# Patient Record
Sex: Female | Born: 1965 | Race: White | Hispanic: No | Marital: Single | Smoking: Current every day smoker
Health system: Southern US, Community
[De-identification: ages and names within clinical notes are randomized; demographics above are authoritative.]

## PROBLEM LIST (undated history)

## (undated) ENCOUNTER — Emergency Department (HOSPITAL_BASED_OUTPATIENT_CLINIC_OR_DEPARTMENT_OTHER): Admission: EM | Payer: Managed Care, Other (non HMO) | Source: Ambulatory Visit

## (undated) DIAGNOSIS — F329 Major depressive disorder, single episode, unspecified: Secondary | ICD-10-CM

## (undated) DIAGNOSIS — E785 Hyperlipidemia, unspecified: Secondary | ICD-10-CM

## (undated) DIAGNOSIS — F32A Depression, unspecified: Secondary | ICD-10-CM

## (undated) DIAGNOSIS — I10 Essential (primary) hypertension: Secondary | ICD-10-CM

## (undated) DIAGNOSIS — D751 Secondary polycythemia: Principal | ICD-10-CM

## (undated) HISTORY — DX: Depression, unspecified: F32.A

## (undated) HISTORY — DX: Secondary polycythemia: D75.1

## (undated) HISTORY — DX: Hyperlipidemia, unspecified: E78.5

## (undated) HISTORY — PX: ABDOMINAL HYSTERECTOMY: SHX81

## (undated) HISTORY — DX: Major depressive disorder, single episode, unspecified: F32.9

---

## 2010-10-03 ENCOUNTER — Emergency Department (HOSPITAL_BASED_OUTPATIENT_CLINIC_OR_DEPARTMENT_OTHER)
Admission: EM | Admit: 2010-10-03 | Discharge: 2010-10-03 | Disposition: A | Discharge status: Home / Self Care | Payer: Managed Care, Other (non HMO) | Attending: Emergency Medicine | Admitting: Emergency Medicine

## 2010-10-03 ENCOUNTER — Encounter: Payer: Self-pay | Admitting: Emergency Medicine

## 2010-10-03 ENCOUNTER — Other Ambulatory Visit: Payer: Self-pay

## 2010-10-03 ENCOUNTER — Emergency Department (INDEPENDENT_AMBULATORY_CARE_PROVIDER_SITE_OTHER): Payer: Managed Care, Other (non HMO)

## 2010-10-03 DIAGNOSIS — F17200 Nicotine dependence, unspecified, uncomplicated: Secondary | ICD-10-CM | POA: Insufficient documentation

## 2010-10-03 DIAGNOSIS — I10 Essential (primary) hypertension: Secondary | ICD-10-CM | POA: Insufficient documentation

## 2010-10-03 DIAGNOSIS — R079 Chest pain, unspecified: Secondary | ICD-10-CM

## 2010-10-03 HISTORY — DX: Essential (primary) hypertension: I10

## 2010-10-03 LAB — D-DIMER, QUANTITATIVE: D-Dimer, Quant: 0.25 ug/mL-FEU (ref 0.00–0.48)

## 2010-10-03 LAB — DIFFERENTIAL
Basophils Absolute: 0.1 10*3/uL (ref 0.0–0.1)
Basophils Relative: 1 % (ref 0–1)
Eosinophils Absolute: 0.1 10*3/uL (ref 0.0–0.7)
Eosinophils Relative: 1 % (ref 0–5)
Monocytes Absolute: 0.5 10*3/uL (ref 0.1–1.0)

## 2010-10-03 LAB — BASIC METABOLIC PANEL
Calcium: 10.1 mg/dL (ref 8.4–10.5)
Creatinine, Ser: 0.7 mg/dL (ref 0.50–1.10)
GFR calc Af Amer: >60 mL/min (ref >60)

## 2010-10-03 LAB — CBC
HCT: 44.7 % (ref 36.0–46.0)
MCHC: 35.8 g/dL (ref 30.0–36.0)
MCV: 92.2 fL (ref 78.0–100.0)
RDW: 12.7 % (ref 11.5–15.5)

## 2010-10-03 MED ORDER — HYDROCODONE-ACETAMINOPHEN 5-325 MG PO TABS: 1.0000 | ORAL_TABLET | ORAL | Status: AC | PRN

## 2010-10-03 MED ORDER — NAPROXEN 500 MG PO TABS: 500.0000 mg | ORAL_TABLET | Freq: Two times a day (BID) | ORAL | Status: DC

## 2010-10-03 NOTE — ED Provider Notes (Signed)
History     CSN: [id number] Arrival date & time: 10/03/2010 11:55 AM  Chief Complaint  Patient presents with  . Chest Pain   Patient is a 45 y.o. female presenting with chest pain. The history is provided by the patient.  Chest Pain Episode onset: She has been having pain for two weeks. Chest pain occurs intermittently. The chest pain is worsening. Associated with: Pain only comes at night. It is not affected by breathing, movement or body position. The quality of the pain is described as sharp. The pain does not radiate. Pertinent negatives for primary symptoms include no fever, no shortness of breath, no cough, no wheezing, no palpitations, no nausea, no vomiting and no dizziness. She tried nothing for the symptoms. Risk factors include smoking/tobacco exposure.   Episodes last 2-5 minutes, and have been coming more frequently. Symptoms are severe. Pain is 10/10 when present, 0/10 currently.  Past Medical History  Diagnosis Date  . Hypertension     Past Surgical History  Procedure Date  . Abdominal hysterectomy     Family History  Problem Relation Age of Onset  . Hypertension Mother     History  Substance Use Topics  . Smoking status: Current Everyday Smoker  . Smokeless tobacco: Not on file  . Alcohol Use: Yes     36-48 oz of malt beverages per day    OB History    Grav Para Term Preterm Abortions TAB SAB Ect Mult Living                  Review of Systems  Constitutional: Negative for fever.  Respiratory: Negative for cough, shortness of breath and wheezing.   Cardiovascular: Positive for chest pain. Negative for palpitations.  Gastrointestinal: Negative for nausea and vomiting.  Neurological: Negative for dizziness.    Physical Exam  BP 150/84  Pulse 73  Temp(Src) 98.3 F (36.8 C) (Oral)  Resp 16  SpO2 99%  Physical Exam  Nursing note and vitals reviewed. Constitutional: She is oriented to person, place, and time. She appears well-developed and  well-nourished. No distress.  HENT:  Head: Normocephalic and atraumatic.  Right Ear: External ear normal.  Left Ear: External ear normal.  Nose: Nose normal.  Mouth/Throat: Oropharynx is clear and moist.  Eyes: Conjunctivae and EOM are normal. Pupils are equal, round, and reactive to light. No scleral icterus.  Neck: Normal range of motion. Neck supple. No JVD present.  Cardiovascular: Normal rate, regular rhythm and normal heart sounds.   No murmur heard. Pulmonary/Chest: Effort normal and breath sounds normal. She has no wheezes. She has no rales. She exhibits no tenderness.  Abdominal: Soft. Bowel sounds are normal. She exhibits no mass. There is no tenderness.  Musculoskeletal: Normal range of motion. She exhibits no edema and no tenderness.  Lymphadenopathy:    She has no cervical adenopathy.  Neurological: She is alert and oriented to person, place, and time. Coordination normal.  Skin: Skin is warm and dry. No rash noted.  Psychiatric: She has a normal mood and affect.    ED Course  Procedures ECG shows normal sinus rhythm with a rate of 67, no ectopy. Normal axis. Normal P wave. Normal QRS. Normal intervals. Normal ST and T waves. Impression: normal ECG.  MDM No sign of significant pathology. Will treat symptomatically. Labs and x-rays reviewed, images viewed by me.  Results for orders placed during the hospital encounter of 10/03/10  CBC      Component Value Range  WBC 7.6  4.0 - 10.5 (K/uL)   RBC 4.85  3.87 - 5.11 (MIL/uL)   Hemoglobin 16.0 (*) 12.0 - 15.0 (g/dL)   HCT 11.9  14.7 - 82.9 (%)   MCV 92.2  78.0 - 100.0 (fL)   MCH 33.0  26.0 - 34.0 (pg)   MCHC 35.8  30.0 - 36.0 (g/dL)   RDW 56.2  13.0 - 86.5 (%)   Platelets 206  150 - 400 (K/uL)  DIFFERENTIAL      Component Value Range   Neutrophils Relative 56  43 - 77 (%)   Neutro Abs 4.3  1.7 - 7.7 (K/uL)   Lymphocytes Relative 36  12 - 46 (%)   Lymphs Abs 2.7  0.7 - 4.0 (K/uL)   Monocytes Relative 6  3 - 12 (%)     Monocytes Absolute 0.5  0.1 - 1.0 (K/uL)   Eosinophils Relative 1  0 - 5 (%)   Eosinophils Absolute 0.1  0.0 - 0.7 (K/uL)   Basophils Relative 1  0 - 1 (%)   Basophils Absolute 0.1  0.0 - 0.1 (K/uL)  BASIC METABOLIC PANEL      Component Value Range   Sodium 135  135 - 145 (mEq/L)   Potassium 3.9  3.5 - 5.1 (mEq/L)   Chloride 101  96 - 112 (mEq/L)   CO2 21  19 - 32 (mEq/L)   Glucose, Bld 97  70 - 99 (mg/dL)   BUN 9  6 - 23 (mg/dL)   Creatinine, Ser 7.84  0.50 - 1.10 (mg/dL)   Calcium 69.6  8.4 - 10.5 (mg/dL)   GFR calc non Af Amer >60  >60 (mL/min)   GFR calc Af Amer >60  >60 (mL/min)  D-DIMER, QUANTITATIVE      Component Value Range   D-Dimer, Quant 0.25  0.00 - 0.48 (ug/mL-FEU)   Dg Chest 2 View  10/03/2010  *RADIOLOGY REPORT*  Clinical Data: 45 year old female with chest pain, night time episodes becoming more frequent.  CHEST - 2 VIEW  Comparison: None.  Findings: Lung volumes are within normal limits, there is mild elevation of the right hemidiaphragm. Normal cardiac size and mediastinal contours.  Visualized tracheal air column is within normal limits.  No pneumothorax, pulmonary edema, pleural effusion or confluent pulmonary opacity.  Mild diffuse increased interstitial markings, likely related to smoking history. No osseous abnormality identified.  IMPRESSION: No acute cardiopulmonary abnormality.  Original Report Authenticated By: Harley Hallmark, M.D.          Dione Booze, MD 10/03/10 1359

## 2010-10-03 NOTE — ED Notes (Signed)
Pt reports intermittent MS CP that wakes her up at night x 2 wks.  Sts episodes are becoming more frequent; only occur at night. Denies other sx, except for a "light sheen of sweat"

## 2011-06-15 ENCOUNTER — Ambulatory Visit: Payer: Managed Care, Other (non HMO) | Admitting: Family Medicine

## 2011-06-16 ENCOUNTER — Ambulatory Visit (INDEPENDENT_AMBULATORY_CARE_PROVIDER_SITE_OTHER): Payer: Managed Care, Other (non HMO) | Admitting: Family Medicine

## 2011-06-16 ENCOUNTER — Encounter: Payer: Self-pay | Admitting: Family Medicine

## 2011-06-16 VITALS — BP 142/82 | HR 65 | Temp 98.2°F | Ht 60.0 in | Wt 159.6 lb

## 2011-06-16 DIAGNOSIS — I10 Essential (primary) hypertension: Secondary | ICD-10-CM

## 2011-06-16 DIAGNOSIS — R1013 Epigastric pain: Secondary | ICD-10-CM | POA: Insufficient documentation

## 2011-06-16 LAB — LIPID PANEL
HDL: 39 mg/dL — ABNORMAL LOW (ref >39.00)
LDL Cholesterol: 123 mg/dL — ABNORMAL HIGH (ref 0–99)
Total CHOL/HDL Ratio: 5
Triglycerides: 150 mg/dL — ABNORMAL HIGH (ref 0.0–149.0)
VLDL: 30 mg/dL (ref 0.0–40.0)

## 2011-06-16 LAB — HEPATIC FUNCTION PANEL
Alkaline Phosphatase: 70 U/L (ref 39–117)
Bilirubin, Direct: 0.1 mg/dL (ref 0.0–0.3)
Total Bilirubin: 0.4 mg/dL (ref 0.3–1.2)
Total Protein: 7.5 g/dL (ref 6.0–8.3)

## 2011-06-16 LAB — CBC WITH DIFFERENTIAL/PLATELET
Basophils Absolute: 0.1 10*3/uL (ref 0.0–0.1)
Eosinophils Absolute: 0.1 10*3/uL (ref 0.0–0.7)
Lymphocytes Relative: 36.3 % (ref 12.0–46.0)
Lymphs Abs: 2.6 10*3/uL (ref 0.7–4.0)
Monocytes Relative: 5.3 % (ref 3.0–12.0)
Platelets: 197 10*3/uL (ref 150.0–400.0)
RDW: 13.4 % (ref 11.5–14.6)

## 2011-06-16 LAB — BASIC METABOLIC PANEL
CO2: 24 mEq/L (ref 19–32)
Calcium: 9.5 mg/dL (ref 8.4–10.5)
Chloride: 102 mEq/L (ref 96–112)
Sodium: 139 mEq/L (ref 135–145)

## 2011-06-16 LAB — LIPASE: Lipase: 19 U/L (ref 11.0–59.0)

## 2011-06-16 MED ORDER — LISINOPRIL-HYDROCHLOROTHIAZIDE 10-12.5 MG PO TABS: 1.0000 | ORAL_TABLET | Freq: Every day | ORAL | Status: DC

## 2011-06-16 MED ORDER — OMEPRAZOLE 40 MG PO CPDR: 40.0000 mg | DELAYED_RELEASE_CAPSULE | Freq: Every day | ORAL | Status: DC

## 2011-06-16 NOTE — Assessment & Plan Note (Signed)
New.  sxs most consistent w/ GERD.  Discussed triggers- caffeine, ETOH, tobacco.  Will check labs to r/o infxn, PUD, pancreatitis.  Start PPI.  Reviewed supportive care and red flags that should prompt return.  Pt expressed understanding and is in agreement w/ plan.

## 2011-06-16 NOTE — Progress Notes (Signed)
  Subjective:    Patient ID: Rachel Scott, female    DOB: [birth year], 46 y.o.   MRN: [id number]  HPI New to establish.  GYN- Dr Noralyn Pick at [facility].  No previous PCP.  HTN- dx'd 1 yr ago, on HCTZ 25mg  daily.  BP not at goal.  Smoking 1 ppd.  Not exercising regularly.  No SOB.  + CP (see below).  No HAs, visual changes, edema.    Epigastric/Substernal pain- went to ER in August for this.  Has had sxs nightly since then.  Was keeping food diary b/c she felt it was food related.  Only occurs at night.  Lasts 3-5 minutes.  Described as 'very sharp'.  'on a scale of 1-10 it's a 15'.  Reports it 'feels like a band across my chest and both arms go numb'.  Reports sxs will dissipate on their own.  Had similar sxs this week after drinking coffee in the AM.  Does not eat late in the evening- eats before 6 and goes to bed at 10.  Will have ETOH 7pm nightly.   Review of Systems For ROS see HPI     Objective:   Physical Exam  Vitals reviewed. Constitutional: She is oriented to person, place, and time. She appears well-developed and well-nourished. No distress.  HENT:  Head: Normocephalic and atraumatic.  Eyes: Conjunctivae and EOM are normal. Pupils are equal, round, and reactive to light.  Neck: Normal range of motion. Neck supple. No thyromegaly present.  Cardiovascular: Normal rate, regular rhythm, normal heart sounds and intact distal pulses.   No murmur heard. Pulmonary/Chest: Effort normal and breath sounds normal. No respiratory distress.  Abdominal: Soft. She exhibits no distension. There is no tenderness.  Musculoskeletal: She exhibits no edema.  Lymphadenopathy:    She has no cervical adenopathy.  Neurological: She is alert and oriented to person, place, and time.  Skin: Skin is warm and dry.  Psychiatric: She has a normal mood and affect. Her behavior is normal.          Assessment & Plan:

## 2011-06-16 NOTE — Patient Instructions (Signed)
Follow up in 1 month to recheck BP Start the Omeprazole daily for the reflux STOP the hydrochlorothiazide START the Lisinopril HCT daily We'll notify you of your lab results Call with any questions or concerns Welcome!  We're glad to have you!

## 2011-06-16 NOTE — Assessment & Plan Note (Signed)
New to provider.  Not well controlled.  Switch to Lisinopril HCT.  EKG normal.  Will follow closely due to med change.

## 2011-06-20 ENCOUNTER — Encounter: Payer: Self-pay | Admitting: Family Medicine

## 2011-06-20 DIAGNOSIS — R12 Heartburn: Secondary | ICD-10-CM

## 2011-06-20 MED ORDER — ESOMEPRAZOLE MAGNESIUM 40 MG PO CPDR: 40.0000 mg | DELAYED_RELEASE_CAPSULE | Freq: Every day | ORAL | Status: DC

## 2011-06-20 NOTE — Telephone Encounter (Signed)
Please advise 

## 2011-06-20 NOTE — Telephone Encounter (Signed)
Called pt and left vm on home phone, called work number and notified pt, pt understood new rx instructions for Nexium 40mg  take one capsule daily, sent via escribe to [facility] on [address], also notified pt that we sent a referral for GI and that someone will contact her with an upcoming apt, pt understood all instructions,

## 2011-06-21 ENCOUNTER — Encounter: Payer: Self-pay | Admitting: Gastroenterology

## 2011-06-22 ENCOUNTER — Encounter: Payer: Self-pay | Admitting: *Deleted

## 2011-07-08 ENCOUNTER — Encounter: Payer: Self-pay | Admitting: Gastroenterology

## 2011-07-08 ENCOUNTER — Ambulatory Visit (INDEPENDENT_AMBULATORY_CARE_PROVIDER_SITE_OTHER): Payer: Managed Care, Other (non HMO) | Admitting: Gastroenterology

## 2011-07-08 VITALS — BP 110/72 | HR 77 | Ht 60.0 in | Wt 156.8 lb

## 2011-07-08 DIAGNOSIS — D45 Polycythemia vera: Secondary | ICD-10-CM

## 2011-07-08 DIAGNOSIS — D751 Secondary polycythemia: Secondary | ICD-10-CM

## 2011-07-08 DIAGNOSIS — K219 Gastro-esophageal reflux disease without esophagitis: Secondary | ICD-10-CM

## 2011-07-08 DIAGNOSIS — R079 Chest pain, unspecified: Secondary | ICD-10-CM

## 2011-07-08 MED ORDER — SUCRALFATE 1 GM/10ML PO SUSP: ORAL | Status: DC

## 2011-07-08 MED ORDER — ESOMEPRAZOLE MAGNESIUM 40 MG PO CPDR: DELAYED_RELEASE_CAPSULE | ORAL | Status: DC

## 2011-07-08 NOTE — Progress Notes (Signed)
.   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 She presents today on referral from Dr. Beverely Low for evaluation of a one year history of atypical chest pain. Her discomfort is in the subxiphoid area and initially was occurring only in the middle of the night when she was awakened from sleep. Over the last several months she's had almost day and nighttime subxiphoid discomfort without typical regurgitation or burning pain or dysphagia. She has not tried antacids or regular PPI. She's had negative cardiac evaluation, and denies any specific cardiovascular or pulmonary complaints. She does take lisinopril-HCTZ daily. Patient not had previous endoscopy or colonoscopy. She recently was placed on Nexium 40 mg a day which she has not taken. She does smoke at least one pack of cigarettes per day, and his use 45 beers a day for many years. There is no history of known hepatitis or pancreatitis. Family history is noncontributory. She denies Raynaud's phenomenon  or any symptoms of collagen vascular disease.  I have reviewed this patient's present history, medical and surgical past history, allergies and medications.     ROS: The remainder of the 10 point ROS is negative... no history of severe allergies. She does have chronic anxiety and chronic insomnia. Patient underwent hysterectomy in December 2011. She has been hypertensive for disease 2 years. She does have mild constipation, history of hemorrhoids, and occasional have some bright red blood per rectum.     Physical Exam: Healthy patient in no distress appears stated age. Blood pressure today 110/72, pulse 77 and regular, weight 156 pounds with a BMI of 30.62. No stigmata of chronic liver disease. General well developed well nourished patient in no acute distress, appearing her stated age Eyes PERRLA, no icterus, fundoscopic exam per opthamologist Skin no lesions noted Neck supple, no adenopathy, no  thyroid enlargement, no tenderness Chest clear to percussion and auscultation Heart no significant murmurs, gallops or rubs noted Abdomen no hepatosplenomegaly masses or tenderness, BS normal. Tenderness in the subxiphoid area without any definite mass or evidence of herniation. Extremities no acute joint lesions, edema, phlebitis or evidence of cellulitis. Neurologic patient oriented x 3, cranial nerves intact, no focal neurologic deficits noted. Psychological mental status normal and normal affect.  Assessment and plan: Her symptoms are consistent with a hiatal hernia and chronic GERD, rule out Barrett's esophagus. I've asked her to take her Nexium 40 mg before dinner with Carafate 1 tablespoon at bedtime and when necessary as needed any endoscopic exam. Since her symptoms have been largely nocturnal, I have suggested that she elevate the head of her bed and avoid food within 4 hours of going to sleep. Currently she takes Benadryl at bedtime to help her sleep. She may need upper abdominal ultrasound exam completeness. Review of labs shows normal CBC and metabolic profile and negative serologies for H. pylori. Patient does have polycythemia with a hemoglobin of 16.7 and hematocrit of 49.1, probably related to her smoking and developing COPD. She may need referral to hematology for evaluation of this problem.  Encounter Diagnosis  Name Primary?  . Esophageal reflux Yes

## 2011-07-08 NOTE — Patient Instructions (Addendum)
You have been given a separate informational sheet regarding your tobacco use, the importance of quitting and local resources to help you quit. Your procedure has been scheduled for 07/15/2011, please follow the seperate instructions.  Take Nexium 30 min before dinner.  Take Carafate 1 tsp everynight before bed.  Your prescription(s) have been sent to you pharmacy, Nexium and Carafate.  Today you watched the [employer].  Follow the [employer].   Diet for GERD or PUD Nutrition therapy can help ease the discomfort of gastroesophageal reflux disease (GERD) and peptic ulcer disease (PUD).  HOME CARE INSTRUCTIONS   Eat your meals slowly, in a relaxed setting.   Eat 5 to 6 small meals per day.   If a food causes distress, stop eating it for a period of time.  FOODS TO AVOID  Coffee, regular or decaffeinated.   Cola beverages, regular or low calorie.   Tea, regular or decaffeinated.   Pepper.   Cocoa.   High fat foods, including meats.   Butter, margarine, hydrogenated oil (trans fats).   Peppermint or spearmint (if you have GERD).   Fruits and vegetables if not tolerated.   Alcohol.   Nicotine (smoking or chewing). This is one of the most potent stimulants to acid production in the gastrointestinal tract.   Any food that seems to aggravate your condition.  If you have questions regarding your diet, ask your caregiver or a registered dietitian. TIPS  Lying flat may make symptoms worse. Keep the head of your bed raised 6 to 9 inches (15 to 23 cm) by using a foam wedge or blocks under the legs of the bed.   Do not lay down until 3 hours after eating a meal.   Daily physical activity may help reduce symptoms.  MAKE SURE YOU:   Understand these instructions.   Will watch your condition.   Will get help right away if you are not doing well or get worse.  Document Released: 02/07/2005 Document Revised: 01/27/2011 Document Reviewed: 12/24/2010 [facility] Patient Information  2012 [place], [place].

## 2011-07-11 ENCOUNTER — Encounter: Payer: Self-pay | Admitting: Family Medicine

## 2011-07-11 NOTE — Telephone Encounter (Signed)
Please note

## 2011-07-13 ENCOUNTER — Ambulatory Visit (INDEPENDENT_AMBULATORY_CARE_PROVIDER_SITE_OTHER): Payer: Managed Care, Other (non HMO) | Admitting: Family Medicine

## 2011-07-13 ENCOUNTER — Encounter: Payer: Self-pay | Admitting: Family Medicine

## 2011-07-13 ENCOUNTER — Telehealth: Payer: Self-pay | Admitting: Gastroenterology

## 2011-07-13 VITALS — BP 118/82 | HR 82 | Temp 98.2°F | Ht 61.0 in | Wt 158.6 lb

## 2011-07-13 DIAGNOSIS — I10 Essential (primary) hypertension: Secondary | ICD-10-CM

## 2011-07-13 DIAGNOSIS — R7989 Other specified abnormal findings of blood chemistry: Secondary | ICD-10-CM | POA: Insufficient documentation

## 2011-07-13 DIAGNOSIS — D582 Other hemoglobinopathies: Secondary | ICD-10-CM

## 2011-07-13 DIAGNOSIS — R718 Other abnormality of red blood cells: Secondary | ICD-10-CM | POA: Insufficient documentation

## 2011-07-13 LAB — HEPATIC FUNCTION PANEL
Alkaline Phosphatase: 66 U/L (ref 39–117)
Bilirubin, Direct: 0 mg/dL (ref 0.0–0.3)

## 2011-07-13 LAB — FOLATE: Folate: 8.4 ng/mL (ref >5.9)

## 2011-07-13 LAB — BASIC METABOLIC PANEL
BUN: 13 mg/dL (ref 6–23)
CO2: 28 mEq/L (ref 19–32)
Calcium: 10.4 mg/dL (ref 8.4–10.5)
Creatinine, Ser: 0.9 mg/dL (ref 0.4–1.2)
Glucose, Bld: 90 mg/dL (ref 70–99)

## 2011-07-13 LAB — CBC WITH DIFFERENTIAL/PLATELET
Basophils Absolute: 0.1 10*3/uL (ref 0.0–0.1)
Eosinophils Absolute: 0.1 10*3/uL (ref 0.0–0.7)
Lymphocytes Relative: 34.4 % (ref 12.0–46.0)
MCHC: 32.9 g/dL (ref 30.0–36.0)
MCV: 103.2 fl — ABNORMAL HIGH (ref 78.0–100.0)
Monocytes Absolute: 0.7 10*3/uL (ref 0.1–1.0)
Neutrophils Relative %: 56.8 % (ref 43.0–77.0)
Platelets: 224 10*3/uL (ref 150.0–400.0)
RDW: 12.8 % (ref 11.5–14.6)

## 2011-07-13 NOTE — Assessment & Plan Note (Signed)
New.  Likely due to pt's daily ETOH intake.  Will repeat LFTs.  Pt has endoscopy and GI f/u pending.

## 2011-07-13 NOTE — Assessment & Plan Note (Signed)
Discovered on labs at last visit.  Normal hemoglobin so not megaloblastic anemia.  Check B12/folate- may be related to pt's ETOH consumption.  Will follow.

## 2011-07-13 NOTE — Assessment & Plan Note (Signed)
Improved.  Check BMP due to additional of lisinopril.  Continue current med unless abnormal labs.

## 2011-07-13 NOTE — Patient Instructions (Signed)
Schedule your complete physical for October- don't eat before this appt We'll notify you of your lab results and make any changes if needed You look great! Continue the lisinopril HCT Call with any questions or concerns Happy Memorial Day!!!

## 2011-07-13 NOTE — Progress Notes (Signed)
  Subjective:    Patient ID: Rachel Scott, female    DOB: [birth year], 46 y.o.   MRN: [id number]  HPI HTN- chronic problem, much better control on Lisinopril HCT.  No CP, SOB, HAs, visual changes, edema.  Elevated LFTs- seen on previous labs, due for recheck  Increased MCV- in setting of normal hgb    Review of Systems For ROS see HPI     Objective:   Physical Exam  Vitals reviewed. Constitutional: She is oriented to person, place, and time. She appears well-developed and well-nourished. No distress.  HENT:  Head: Normocephalic and atraumatic.  Eyes: Conjunctivae and EOM are normal. Pupils are equal, round, and reactive to light.  Neck: Normal range of motion. Neck supple. No thyromegaly present.  Cardiovascular: Normal rate, regular rhythm, normal heart sounds and intact distal pulses.   No murmur heard. Pulmonary/Chest: Effort normal and breath sounds normal. No respiratory distress.  Abdominal: Soft. She exhibits no distension. There is no tenderness.  Musculoskeletal: She exhibits no edema.  Lymphadenopathy:    She has no cervical adenopathy.  Neurological: She is alert and oriented to person, place, and time.  Skin: Skin is warm and dry.  Psychiatric: She has a normal mood and affect. Her behavior is normal.          Assessment & Plan:

## 2011-07-13 NOTE — Telephone Encounter (Signed)
Pt reports she saw her PCP this am and spoke with her about her upcoming EGD and possible GB problems. Dr Beverely Low made her understand why she needs the EGD 1st. Also, Dr Jarold Motto wrote in his notes if EGD is OK he will probably order an U/S of the upper abdomen. Pt stated understanding.

## 2011-07-15 ENCOUNTER — Ambulatory Visit (AMBULATORY_SURGERY_CENTER): Payer: Managed Care, Other (non HMO) | Admitting: Gastroenterology

## 2011-07-15 ENCOUNTER — Encounter: Payer: Self-pay | Admitting: Gastroenterology

## 2011-07-15 VITALS — BP 113/72 | HR 78 | Temp 97.7°F | Resp 16 | Ht 61.0 in | Wt 158.0 lb

## 2011-07-15 DIAGNOSIS — R1013 Epigastric pain: Secondary | ICD-10-CM

## 2011-07-15 DIAGNOSIS — K219 Gastro-esophageal reflux disease without esophagitis: Secondary | ICD-10-CM

## 2011-07-15 DIAGNOSIS — R079 Chest pain, unspecified: Secondary | ICD-10-CM

## 2011-07-15 DIAGNOSIS — D130 Benign neoplasm of esophagus: Secondary | ICD-10-CM

## 2011-07-15 MED ORDER — SODIUM CHLORIDE 0.9 % IV SOLN: 500.0000 mL | INTRAVENOUS | Status: DC

## 2011-07-15 NOTE — Patient Instructions (Signed)
FOLLOW DISCHARGE INSTRUCTIONS. HIATAL HERNIA SEEN TODAY. SEE HANDOUT. RESUME CURRENT MEDICATIONS. AWAIT BIOPSIES RESULTS. DR.PATTERSON'S OFFICE NURSE WILL CALL YOU WITH GALLBLADDER SCAN APPOINTMENT.  THANK YOU!   YOU HAD AN ENDOSCOPIC PROCEDURE TODAY AT THE [facility]: Refer to the procedure report that was given to you for any specific questions about what was found during the examination.  If the procedure report does not answer your questions, please call your gastroenterologist to clarify.  If you requested that your care partner not be given the details of your procedure findings, then the procedure report has been included in a sealed envelope for you to review at your convenience later.  YOU SHOULD EXPECT: Some feelings of bloating in the abdomen. Passage of more gas than usual.  Walking can help get rid of the air that was put into your GI tract during the procedure and reduce the bloating. If you had a lower endoscopy (such as a colonoscopy or flexible sigmoidoscopy) you may notice spotting of blood in your stool or on the toilet paper. If you underwent a bowel prep for your procedure, then you may not have a normal bowel movement for a few days.  DIET: Your first meal following the procedure should be a light meal and then it is ok to progress to your normal diet.  A half-sandwich or bowl of soup is an example of a good first meal.  Heavy or fried foods are harder to digest and may make you feel nauseous or bloated.  Likewise meals heavy in dairy and vegetables can cause extra gas to form and this can also increase the bloating.  Drink plenty of fluids but you should avoid alcoholic beverages for 24 hours.  ACTIVITY: Your care partner should take you home directly after the procedure.  You should plan to take it easy, moving slowly for the rest of the day.  You can resume normal activity the day after the procedure however you should NOT DRIVE or use heavy machinery for 24 hours  (because of the sedation medicines used during the test).    SYMPTOMS TO REPORT IMMEDIATELY: A gastroenterologist can be reached at any hour.  During normal business hours, 8:30 AM to 5:00 PM Monday through Friday, call [phone].  After hours and on weekends, please call the GI answering service at [phone] who will take a message and have the physician on call contact you.    Following upper endoscopy (EGD)  Vomiting of blood or coffee ground material  New chest pain or pain under the shoulder blades  Painful or persistently difficult swallowing  New shortness of breath  Fever of 100F or higher  Black, tarry-looking stools  FOLLOW UP: If any biopsies were taken you will be contacted by phone or by letter within the next 1-3 weeks.  Call your gastroenterologist if you have not heard about the biopsies in 3 weeks.  Our staff will call the home number listed on your records the next business day following your procedure to check on you and address any questions or concerns that you may have at that time regarding the information given to you following your procedure. This is a courtesy call and so if there is no answer at the home number and we have not heard from you through the emergency physician on call, we will assume that you have returned to your regular daily activities without incident.  SIGNATURES/CONFIDENTIALITY: You and/or your care partner have signed paperwork which will be entered  into your electronic medical record.  These signatures attest to the fact that that the information above on your After Visit Summary has been reviewed and is understood.  Full responsibility of the confidentiality of this discharge information lies with you and/or your care-partner.

## 2011-07-15 NOTE — Op Note (Signed)
[facility] [address] [place], [place]  [address]  ENDOSCOPY PROCEDURE REPORT  PATIENT:  Rachel, Scott  MR#:  [id number] BIRTHDATE:  [birth year], 45 yrs. old  GENDER:  female  ENDOSCOPIST:  Vania Rea. Jarold Motto, MD, [facility] Referred by:  Neena Rhymes, M.D.  PROCEDURE DATE:  07/15/2011 PROCEDURE:  EGD with biopsy for H. pylori 19147, EGD with biopsy, 43239 ASA CLASS:  Class II INDICATIONS:  CHEST UNRESPONSIVE TO RX.  MEDICATIONS:   propofol (Diprivan) 180 mg IV TOPICAL ANESTHETIC:  DESCRIPTION OF PROCEDURE:   After the risks and benefits of the procedure were explained, informed consent was obtained.  The LB GIF-H180 [id number] endoscope was introduced through the mouth and advanced to the second portion of the duodenum.  The instrument was slowly withdrawn as the mucosa was fully examined. <<PROCEDUREIMAGES>>  The upper, middle, and distal third of the esophagus were carefully inspected and no abnormalities were noted. The z-line was well seen at the GEJ. The endoscope was pushed into the fundus which was normal including a retroflexed view. The antrum,gastric body, first and second part of the duodenum were unremarkable. ESOPHAGEAL BIOPSIES DONE.CLO BX. DONE.    Retroflexed views revealed a hiatal hernia.  SMALL HH NOTED AND SCHATZSKI'S RING. The scope was then withdrawn from the patient and the procedure completed.  COMPLICATIONS:  None  ENDOSCOPIC IMPRESSION: 1) Normal EGD 2) A hiatal hernia RECOMMENDATIONS: 1) Await biopsy results  ______________________________ Vania Rea. Jarold Motto, MD, [facility]  CC:  n. eSIGNED:   Vania Rea. Alcides Nutting at 07/15/2011 04:05 PM  Erskine Emery, [phone]

## 2011-07-15 NOTE — Progress Notes (Signed)
Patient did not experience any of the following events: a burn prior to discharge; a fall within the facility; wrong site/side/patient/procedure/implant event; or a hospital transfer or hospital admission upon discharge from the facility. (G8907) Patient did not have preoperative order for IV antibiotic SSI prophylaxis. (G8918)  

## 2011-07-19 ENCOUNTER — Telehealth: Payer: Self-pay

## 2011-07-19 ENCOUNTER — Ambulatory Visit: Payer: Managed Care, Other (non HMO) | Admitting: Family Medicine

## 2011-07-19 LAB — HELICOBACTER PYLORI SCREEN-BIOPSY: UREASE: NEGATIVE

## 2011-07-19 NOTE — Telephone Encounter (Signed)
I left a message on the ID answering machine at 07:49 for the pt to call if any questions or concerns. Maw

## 2011-07-20 ENCOUNTER — Telehealth: Payer: Self-pay | Admitting: *Deleted

## 2011-07-20 ENCOUNTER — Encounter: Payer: Self-pay | Admitting: Gastroenterology

## 2011-07-20 ENCOUNTER — Other Ambulatory Visit: Payer: Self-pay | Admitting: Gastroenterology

## 2011-07-20 NOTE — Telephone Encounter (Signed)
I have spoken to patient and advised her of time and date of abdominal ultrasound as well as all prep instructions. Patient verbalizes understanding but may call back and reschedule.

## 2011-07-20 NOTE — Telephone Encounter (Signed)
Dr Jarold Motto asked that patient be scheduled for an abdominal ultrasound after negative endoscopy for chest pain as well as negative cardiology eval. Patient has been scheduled for an appointment for ultrasound on Friday, 07/22/11 @ 8 am [facility] radiology. I have left a voicemail for patient to call back.

## 2011-07-21 ENCOUNTER — Encounter: Payer: Self-pay | Admitting: Gastroenterology

## 2011-07-22 ENCOUNTER — Other Ambulatory Visit (HOSPITAL_COMMUNITY): Payer: Managed Care, Other (non HMO)

## 2011-07-25 ENCOUNTER — Ambulatory Visit (HOSPITAL_COMMUNITY)
Admission: RE | Admit: 2011-07-25 | Discharge: 2011-07-25 | Disposition: A | Discharge status: Home / Self Care | Payer: Managed Care, Other (non HMO) | Source: Ambulatory Visit | Attending: Gastroenterology | Admitting: Gastroenterology

## 2011-07-25 DIAGNOSIS — R079 Chest pain, unspecified: Secondary | ICD-10-CM | POA: Insufficient documentation

## 2011-07-25 DIAGNOSIS — R6889 Other general symptoms and signs: Secondary | ICD-10-CM | POA: Insufficient documentation

## 2011-07-25 DIAGNOSIS — R1013 Epigastric pain: Secondary | ICD-10-CM | POA: Insufficient documentation

## 2011-08-16 NOTE — Addendum Note (Signed)
Addended by: Derry Lory A on: 08/16/2011 11:44 AM   Modules accepted: Orders

## 2011-08-17 ENCOUNTER — Telehealth: Payer: Self-pay | Admitting: Hematology & Oncology

## 2011-08-17 NOTE — Telephone Encounter (Signed)
Pt aware of 7-18 appointment °

## 2011-08-22 ENCOUNTER — Telehealth: Payer: Self-pay | Admitting: Family Medicine

## 2011-08-22 NOTE — Telephone Encounter (Signed)
Patient calling, states she is returning someone's call from Friday, 08/19/11, around 6:30pm.  Please return patient's call at [phone].

## 2011-08-22 NOTE — Telephone Encounter (Signed)
Called pt to advise lab results at work and was routed to a answering service, left detailed message on home phone to advise the phone call is noted in pt chart via lab results, called back to pt work and noted did reach pt vm via work, left message to advise to call back per labs results.

## 2011-08-23 ENCOUNTER — Encounter: Payer: Self-pay | Admitting: Family Medicine

## 2011-08-23 NOTE — Telephone Encounter (Signed)
.  left message to have patient return my call on home phone and tried to call work and courtney routed me into [employer] times two, will call again

## 2011-08-24 NOTE — Telephone Encounter (Signed)
Please note phone calls were to advise pt needs BMP repeat, please advise pt concern

## 2011-08-24 NOTE — Telephone Encounter (Signed)
Noted pt sent MY chart message to advise she can not call office during office hours due to her work hours, message was sent to pt to advise she needs to come in to office for a repeat BMP however pt does have upcoming apt with hemotology and MD Beverely Low advised for the pt to have BMP there via My chart message, pt will communicate via my chart if any further concerns.

## 2011-09-02 ENCOUNTER — Encounter: Payer: Self-pay | Admitting: *Deleted

## 2011-09-08 ENCOUNTER — Ambulatory Visit: Payer: Managed Care, Other (non HMO)

## 2011-09-08 ENCOUNTER — Encounter: Payer: Self-pay | Admitting: Family Medicine

## 2011-09-08 ENCOUNTER — Other Ambulatory Visit (HOSPITAL_BASED_OUTPATIENT_CLINIC_OR_DEPARTMENT_OTHER): Payer: Managed Care, Other (non HMO) | Admitting: Lab

## 2011-09-08 ENCOUNTER — Ambulatory Visit (HOSPITAL_BASED_OUTPATIENT_CLINIC_OR_DEPARTMENT_OTHER): Payer: Managed Care, Other (non HMO) | Admitting: Hematology & Oncology

## 2011-09-08 VITALS — BP 109/70 | HR 61 | Temp 97.3°F | Ht 60.0 in | Wt 155.0 lb

## 2011-09-08 DIAGNOSIS — D751 Secondary polycythemia: Secondary | ICD-10-CM

## 2011-09-08 LAB — CBC WITH DIFFERENTIAL (CANCER CENTER ONLY)
BASO#: 0.1 10*3/uL (ref 0.0–0.2)
BASO%: 1.1 % (ref 0.0–2.0)
EOS%: 1.5 % (ref 0.0–7.0)
HGB: 16.6 g/dL — ABNORMAL HIGH (ref 11.6–15.9)
LYMPH#: 3 10*3/uL (ref 0.9–3.3)
MCH: 34.3 pg — ABNORMAL HIGH (ref 26.0–34.0)
MCHC: 36.4 g/dL — ABNORMAL HIGH (ref 32.0–36.0)
MONO%: 8.7 % (ref 0.0–13.0)
NEUT#: 4.2 10*3/uL (ref 1.5–6.5)
Platelets: 203 10*3/uL (ref 145–400)
RDW: 12.5 % (ref 11.1–15.7)

## 2011-09-08 LAB — CHCC SATELLITE - SMEAR

## 2011-09-08 NOTE — Progress Notes (Signed)
This office note has been dictated.

## 2011-09-09 LAB — LACTATE DEHYDROGENASE: LDH: 135 U/L (ref 94–250)

## 2011-09-09 LAB — FERRITIN: Ferritin: 67 ng/mL (ref 10–291)

## 2011-09-09 NOTE — Progress Notes (Signed)
CC:   Rachel Scott, M.D.  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  HISTORY OF PRESENT ILLNESS:  Rachel Scott is a real nice, 46 year old white female.  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  The patient is followed by Rachel Scott.  Rachel Scott ordered abdominal ultrasound recently.  This was I think done back in early June.  Described some elevated LFTs.  This showed a questionable fatty liver.  There was no splenomegaly.  There was no lymphadenopathy.  Rachel Scott has noted that she has had an increase in her red cell count.  She also noted that she has had an increase in her MCV and MCH.  As such, Rachel Scott felt that hematologic evaluation was indicated. Rachel Scott has not had any change in her medications.  She has had no rashes.  There has been no fever.  There has been no bony pain.  She has had no change in bowel or bladder habits.  She does get her routine mammograms.  She had her last chest x-ray back in August of last year.  Everything looked okay.  Rachel Scott did some lab work on her.  She was found to have a white cell count of 9.6, hemoglobin 16.5, hematocrit 50.1, platelet count 224.  MCV is 103.  Back in August 2012, her hemoglobin was 16, hematocrit 44.7 with MCV of 92.  She has had no weight loss or weight gain.  She has had no dysphasia or odynophagia.  PAST MEDICAL HISTORY: 1. Remarkable for hypertension. 2. Hyperlipidemia.  ALLERGIES:  None.  MEDICATIONS: 1. Zestoretic (10/12) 1 p.o. daily. 2. Zantac 150 mg p.o. daily.  SOCIAL HISTORY:  Remarkable for a pack per day tobacco use history.  She does have some alcohol use but only social.  FAMILY HISTORY:  Noncontributory. There is no history of blood issues in the family.  REVIEW OF SYSTEMS:  As stated in history of present illness.  No additional findings noted on a 12-system review.  PHYSICAL EXAMINATION:  This is a  well-developed, well-nourished white female in no obvious distress.  Vital signs:  Temperature of 97.3, pulse 61, respiratory rate 18, blood pressure 109/70, weight is 155.  Head and neck:  Normocephalic, atraumatic skull.  There are no ocular or oral lesions.  There is no scleral icterus.  There is no conjunctival inflammation.  No adenopathy in her neck is noted.  Thyroid is not palpable.  Lungs:  Clear bilaterally.  There are no rales, wheezes or rhonchi.  Cardiac:  Regular rate and rhythm with a normal S1 and S2. There are no murmurs, rubs or bruits.  Abdomen:  Soft with good bowel sounds.  There is no palpable abdominal mass.  There is no palpable hepatosplenomegaly.  Back:  No tenderness over the spine, ribs, or hips. Extremities:  Shows no clubbing, cyanosis or edema.  Neurological: Shows no focal neurological deficits.  Skin:  No rashes, ecchymosis or petechiae.  LABORATORY STUDIES:  Show a white cell count 8.1, hemoglobin 16.6, hematocrit 45.6, platelet count 203.  MCV is 94. Peripheral smear shows a normochromic, normocytic population of red blood cells.  There are no nucleated red blood cells.  I see no rouleaux formation.  There are no teardrop cells.  There is no schistocytes or spherocytes.  White cells are with good maturation of her polys.  There are no hypersegmented polys.  I see no immature myeloid or lymphoid  forms.  There is no atypical lymphocytes.  Platelets are adequate in number and size. Her ferritin is 67.  Her vitamin B12 is 452.  Her LDH is 135.  IMPRESSION:  Rachel Scott is a very nice, 46 year old, white female.  She has minimal erythrocytosis.  I would not even qualify her for polycythemia.  I suppose she may have secondary polycythemia because of tobacco use.  I do not believe there is any primary bone marrow issue.  Her blood smear is reassuring.  Physical exam is also reassuring for the lack of splenomegaly and lymphadenopathy.  I have taken her  erythropoietin level.  I am also checking a B12 level on her.  I would be surprised if any of these are abnormal.  I suppose that she may have __________ polycythemia given that she does have hypertension and does smoke.  This is related to __________ of her plasma.  I reassured Rachel Scott.  I told her that I do not feel like she needed any invasive studies.  I do not see a need for any x-ray tests.  I certainly do not see a need for a bone marrow test.  I do want to get her back in about 3 or 4 months.  I feel this would be a good amount of followup so that we can see if there is a trend with her blood counts.  I spent a good hour or so with Rachel Scott.  She is very, very nice.    ______________________________ Josph Macho, M.D. PRE/MEDQ  D:  09/08/2011  T:  09/09/2011  Job:  [id number]

## 2011-09-09 NOTE — Telephone Encounter (Signed)
Please note pt concern, however also note the following result note from 07-13-11:  Please refer pt to hematology for elevated MCV and hgb Also, please call her to set up repeat lab draw for BMP (dx- hyperkalemia) She is aware of results via my chart and is expecting a call  Pt has been contacted several times as well as by mychart as well, advised to have Ennover test for her apt with him, however that apt was noted yesterday and at this point no BMP test is showing

## 2011-09-12 ENCOUNTER — Telehealth: Payer: Self-pay | Admitting: *Deleted

## 2011-09-12 DIAGNOSIS — I10 Essential (primary) hypertension: Secondary | ICD-10-CM

## 2011-09-12 NOTE — Telephone Encounter (Signed)
Pt sent request to have 90 day supply of all [facility] via my chart, however [facility], emailed [facility] to [facility], will await response prior to sending.

## 2011-09-13 ENCOUNTER — Encounter: Payer: Self-pay | Admitting: *Deleted

## 2011-09-13 MED ORDER — RANITIDINE HCL 150 MG PO TABS: 150.0000 mg | ORAL_TABLET | Freq: Every day | ORAL | Status: DC

## 2011-09-13 MED ORDER — LISINOPRIL-HYDROCHLOROTHIAZIDE 10-12.5 MG PO TABS: 1.0000 | ORAL_TABLET | Freq: Every day | ORAL | Status: DC

## 2011-09-13 NOTE — Telephone Encounter (Signed)
Pt replied that she wants the RX's to go to [facility] on [address], sent refill for lisinopril and rantidine #90 no refills to [facility] [address] via escribe pt aware via my chart

## 2011-09-26 ENCOUNTER — Encounter: Payer: Self-pay | Admitting: Family Medicine

## 2011-09-26 ENCOUNTER — Other Ambulatory Visit: Payer: Self-pay | Admitting: *Deleted

## 2011-09-27 ENCOUNTER — Encounter: Payer: Self-pay | Admitting: Family Medicine

## 2011-09-27 NOTE — Telephone Encounter (Signed)
Noted MD Tabori placed order in chart for future and pt was notified via My chart message

## 2011-09-27 NOTE — Telephone Encounter (Signed)
Please advise 

## 2011-11-10 ENCOUNTER — Ambulatory Visit: Payer: Managed Care, Other (non HMO) | Admitting: Hematology & Oncology

## 2011-11-10 ENCOUNTER — Other Ambulatory Visit: Payer: Managed Care, Other (non HMO) | Admitting: Lab

## 2011-12-06 ENCOUNTER — Encounter: Payer: Self-pay | Admitting: Family Medicine

## 2011-12-09 ENCOUNTER — Other Ambulatory Visit: Payer: Self-pay | Admitting: Family Medicine

## 2011-12-09 ENCOUNTER — Ambulatory Visit (HOSPITAL_BASED_OUTPATIENT_CLINIC_OR_DEPARTMENT_OTHER): Payer: Managed Care, Other (non HMO) | Admitting: Hematology & Oncology

## 2011-12-09 ENCOUNTER — Other Ambulatory Visit (HOSPITAL_BASED_OUTPATIENT_CLINIC_OR_DEPARTMENT_OTHER): Payer: Managed Care, Other (non HMO) | Admitting: Lab

## 2011-12-09 VITALS — BP 118/80 | HR 79 | Temp 97.4°F | Resp 24 | Wt 157.0 lb

## 2011-12-09 DIAGNOSIS — D751 Secondary polycythemia: Secondary | ICD-10-CM

## 2011-12-09 DIAGNOSIS — I10 Essential (primary) hypertension: Secondary | ICD-10-CM

## 2011-12-09 DIAGNOSIS — R7989 Other specified abnormal findings of blood chemistry: Secondary | ICD-10-CM

## 2011-12-09 DIAGNOSIS — F17200 Nicotine dependence, unspecified, uncomplicated: Secondary | ICD-10-CM

## 2011-12-09 DIAGNOSIS — Z0000 Encounter for general adult medical examination without abnormal findings: Secondary | ICD-10-CM

## 2011-12-09 LAB — CBC WITH DIFFERENTIAL (CANCER CENTER ONLY)
BASO#: 0.1 10*3/uL (ref 0.0–0.2)
EOS%: 1.6 % (ref 0.0–7.0)
Eosinophils Absolute: 0.1 10*3/uL (ref 0.0–0.5)
HCT: 43.6 % (ref 34.8–46.6)
HGB: 15.7 g/dL (ref 11.6–15.9)
LYMPH%: 34.4 % (ref 14.0–48.0)
MCH: 33.8 pg (ref 26.0–34.0)
MCHC: 36 g/dL (ref 32.0–36.0)
MCV: 94 fL (ref 81–101)
MONO%: 7.4 % (ref 0.0–13.0)
NEUT#: 4.1 10*3/uL (ref 1.5–6.5)
NEUT%: 55.8 % (ref 39.6–80.0)
RBC: 4.64 10*6/uL (ref 3.70–5.32)

## 2011-12-09 NOTE — Telephone Encounter (Signed)
Please add labs needed

## 2011-12-09 NOTE — Progress Notes (Signed)
This office note has been dictated.

## 2011-12-09 NOTE — Progress Notes (Signed)
CC:   Neena Rhymes, M.D.  DIAGNOSIS:  Erythrocytosis.  CURRENT THERAPY:  Observation.  INTERVAL HISTORY:  Ms. Dorfman comes in for followup.  We initially saw her back in July.  She is doing okay.  She is still smoking.  She is trying to cut back a little bit.  When we first saw her, our workup pretty much was unremarkable for polycythemia vera.  She did have a low erythropoietin level.  It was only 3.3.  Her ferritin was 67.  B12 was 452.  LDH was 135.  She feels fairly well.  There is no headache.  There is no chest pain. She has had no cough or shortness of breath.  There is no fever, sweats, or chills.  She is not taking a baby aspirin.  I told her that she really needs to take 1 baby aspirin a day, particularly since she is smoking.  PHYSICAL EXAMINATION:  This is a well-developed, well-nourished white female in no obvious distress.  Vital signs:  Temperature of 97.4, pulse 79, respiratory rate 18, blood pressure is 118/80.  Weight is 157.  Head and neck:  Normocephalic, atraumatic skull.  There are no ocular or oral lesions.  There are no palpable cervical or supraclavicular lymph nodes. Lungs:  Clear bilaterally.  Cardiac:  Regular rate and rhythm with a normal S1, S2.  There are no murmurs or rubs or bruits.  Abdomen:  Soft with good bowel sounds.  There is no palpable abdominal mass.  There is no fluid wave.  There is no palpable hepatosplenomegaly.  Extremities: No clubbing, cyanosis or edema.  Skin:  Some dry skin.  There is no ruddy complexion.  There is no facial plethora.  LABORATORY STUDIES:  White cell count is 7, hemoglobin 15.7, hematocrit 43.7, platelet count 200.  IMPRESSION:  Ms. Moultry is a 46 year old white female with erythrocytosis.  I really believe that this is secondary polycythemia. I believe that this is related to her smoking.  Again, aspirin would definitely be a good idea for her.  She says that she will take this. I will plan to get her  back in 6 months.  I think if looks good in 6 months, then we should be able to let her go from the clinic.    ______________________________ Josph Macho, M.D. PRE/MEDQ  D:  12/09/2011  T:  12/09/2011  Job:  [id number]

## 2011-12-13 ENCOUNTER — Encounter: Payer: Self-pay | Admitting: Family Medicine

## 2011-12-13 ENCOUNTER — Ambulatory Visit (INDEPENDENT_AMBULATORY_CARE_PROVIDER_SITE_OTHER): Payer: Managed Care, Other (non HMO) | Admitting: Family Medicine

## 2011-12-13 VITALS — BP 112/78 | HR 79 | Temp 97.7°F | Ht 61.0 in | Wt 156.6 lb

## 2011-12-13 DIAGNOSIS — F411 Generalized anxiety disorder: Secondary | ICD-10-CM

## 2011-12-13 DIAGNOSIS — Z0000 Encounter for general adult medical examination without abnormal findings: Secondary | ICD-10-CM | POA: Insufficient documentation

## 2011-12-13 DIAGNOSIS — F419 Anxiety disorder, unspecified: Secondary | ICD-10-CM

## 2011-12-13 DIAGNOSIS — I10 Essential (primary) hypertension: Secondary | ICD-10-CM

## 2011-12-13 MED ORDER — BUPROPION HCL ER (XL) 150 MG PO TB24: 150.0000 mg | ORAL_TABLET | Freq: Every day | ORAL | Status: DC

## 2011-12-13 MED ORDER — RANITIDINE HCL 150 MG PO TABS: 150.0000 mg | ORAL_TABLET | Freq: Every day | ORAL | Status: DC

## 2011-12-13 MED ORDER — LISINOPRIL-HYDROCHLOROTHIAZIDE 10-12.5 MG PO TABS: 1.0000 | ORAL_TABLET | Freq: Every day | ORAL | Status: DC

## 2011-12-13 NOTE — Assessment & Plan Note (Signed)
Chronic problem, well controlled.  Asymptomatic.  Recommended smoking cessation.

## 2011-12-13 NOTE — Progress Notes (Signed)
  Subjective:    Patient ID: Rachel Scott, female    DOB: 1965/03/07, 46 y.o.   MRN: [id number]  HPI CPE- GYN Cristie Hem in [facility].  Declines flu, pneumovax, DEXA, colonoscopy.  Stress- job related, has worsened in last few months due to work [employer].  Has tried to do breathing exercises for relaxation, walking to and from work.  Work is spilling into personal life- more snappy, short fuse.  Not interested in benzo.     Review of Systems Patient reports no vision/ hearing changes, adenopathy,fever, weight change,  persistant/recurrent hoarseness , swallowing issues, chest pain, palpitations, edema, persistant/recurrent cough, hemoptysis, dyspnea (rest/exertional/paroxysmal nocturnal), gastrointestinal bleeding (melena, rectal bleeding), abdominal pain, significant heartburn, bowel changes, GU symptoms (dysuria, hematuria, incontinence), Gyn symptoms (abnormal  bleeding, pain),  syncope, focal weakness, memory loss, numbness & tingling, skin/hair/nail changes, abnormal bruising or bleeding, anxiety, or depression.     Objective:   Physical Exam General Appearance:    Alert, cooperative, no distress, appears stated age  Head:    Normocephalic, without obvious abnormality, atraumatic  Eyes:    PERRL, conjunctiva/corneas clear, EOM's intact, fundi    benign, both eyes  Ears:    Normal TM's and external ear canals, both ears  Nose:   Nares normal, septum midline, mucosa normal, no drainage    or sinus tenderness  Throat:   Lips, mucosa, and tongue normal; teeth and gums normal  Neck:   Supple, symmetrical, trachea midline, no adenopathy;    Thyroid: no enlargement/tenderness/nodules  Back:     Symmetric, no curvature, ROM normal, no CVA tenderness  Lungs:     Clear to auscultation bilaterally, respirations unlabored  Chest Wall:    No tenderness or deformity   Heart:    Regular rate and rhythm, S1 and S2 normal, no murmur, rub   or gallop  Breast Exam:    Deferred to  GYN  Abdomen:     Soft, non-tender, bowel sounds active all four quadrants,    no masses, no organomegaly  Genitalia:    Deferred to GYN  Rectal:    Extremities:   Extremities normal, atraumatic, no cyanosis or edema  Pulses:   2+ and symmetric all extremities  Skin:   Skin color, texture, turgor normal, no rashes or lesions  Lymph nodes:   Cervical, supraclavicular, and axillary nodes normal  Neurologic:   CNII-XII intact, normal strength, sensation and reflexes    throughout          Assessment & Plan:

## 2011-12-13 NOTE — Patient Instructions (Addendum)
Follow up in 6 weeks to recheck stress and anxiety Start the Wellbutrin daily for stress/mood STOP SMOKING! Call with any questions or concerns Happy Early Birthday!!

## 2011-12-13 NOTE — Assessment & Plan Note (Signed)
New.  Pt's sxs are worsening to point of affecting personal life.  Would like help in managing her anxiety but cannot be cognitively compromised at work.  Will start Wellbutrin in hopes of controlling anxiety and also assisting w/ smoking cessation.  Pt expressed understanding and is in agreement w/ plan.

## 2011-12-13 NOTE — Assessment & Plan Note (Signed)
Pt's PE WNL.  Refusing health maintenance at this time.  Has GYN.  Check labs.  Anticipatory guidance provided.

## 2012-01-09 ENCOUNTER — Telehealth: Payer: Self-pay | Admitting: *Deleted

## 2012-01-09 NOTE — Telephone Encounter (Signed)
Message copied by Florene Glen on Mon Jan 09, 2012  9:06 AM ------      Message from: Florene Glen      Created: Mon Dec 19, 2011  8:43 AM       Look for lft results ordered by Dr Beverely Low and route to Dr Jarold Motto.      Pt saw Dr Beverely Low and labs had not been drawn on 12/19/11

## 2012-01-09 NOTE — Telephone Encounter (Signed)
Pt left me a message stating she had labs done at Dr Gustavo Lah . I called there and they only did a CBC. Left her a message with our lab hours or she may go to Dr Tabori's ofc.

## 2012-01-24 ENCOUNTER — Ambulatory Visit: Payer: Managed Care, Other (non HMO) | Admitting: Family Medicine

## 2012-01-27 NOTE — Telephone Encounter (Signed)
Never heard back from pt; she has pending labs in the computer.

## 2012-04-07 ENCOUNTER — Other Ambulatory Visit: Payer: Self-pay

## 2012-06-05 ENCOUNTER — Telehealth: Payer: Self-pay | Admitting: Hematology & Oncology

## 2012-06-05 NOTE — Telephone Encounter (Signed)
Patient called and cx 06/08/12 apt and stated she will call back to resch

## 2012-06-08 ENCOUNTER — Ambulatory Visit: Payer: Managed Care, Other (non HMO) | Admitting: Hematology & Oncology

## 2012-06-08 ENCOUNTER — Other Ambulatory Visit: Payer: Managed Care, Other (non HMO) | Admitting: Lab

## 2012-11-08 IMAGING — US US ABDOMEN COMPLETE
1 series · 14 of 25 positions shown · non-contrast
Comparison: None

CLINICAL DATA: Elevated ALT, chest and epigastric pain

ULTRASOUND ABDOMEN:
TECHNIQUE: Sonography of upper abdominal structures was performed.

[Series 1: us abdomen complete · 0.28mm/px · 14 of 61 slices shown]
[im 1/61]
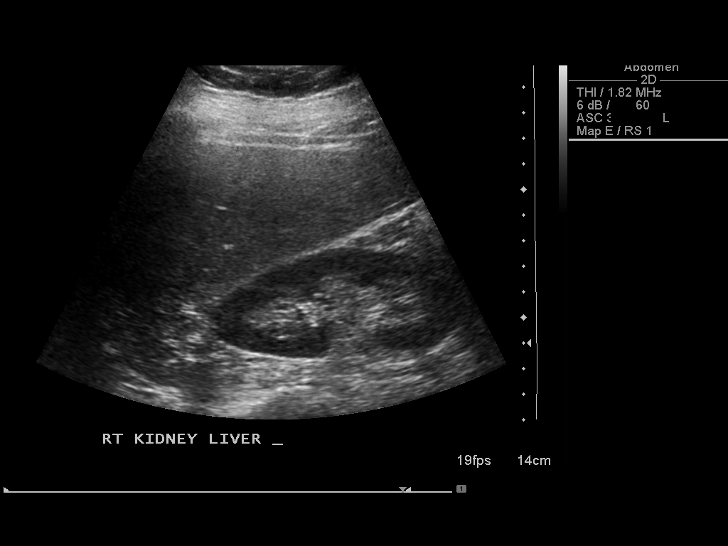
[im 6/61]
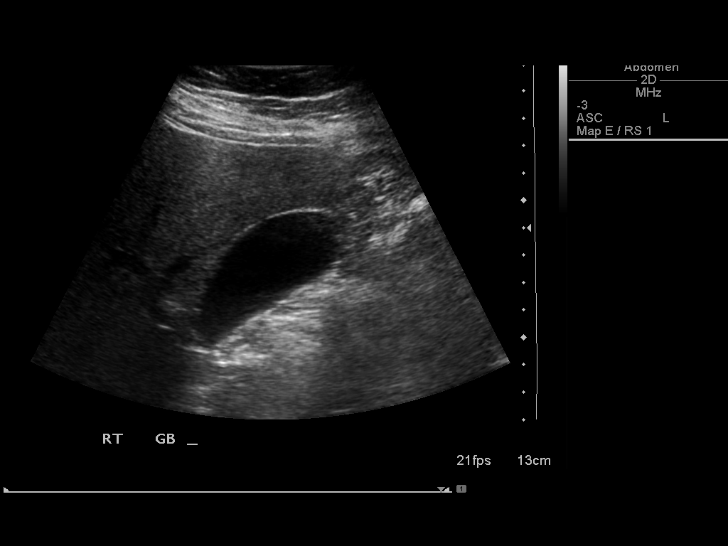
[im 11/61]
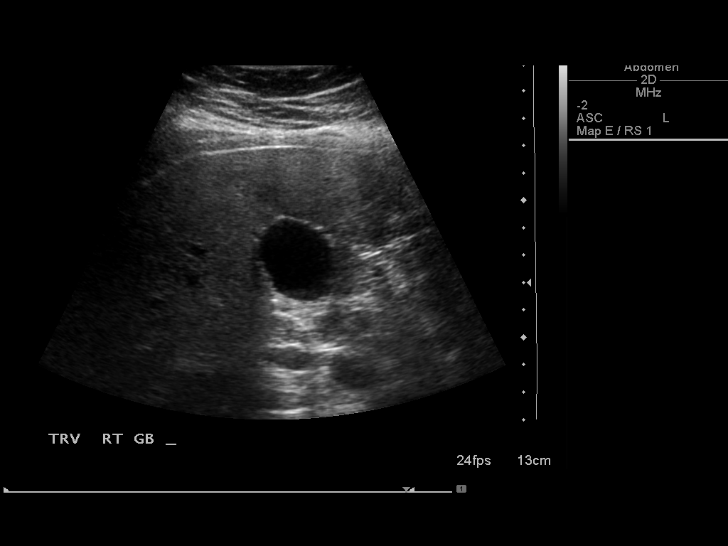
[im 16/61]
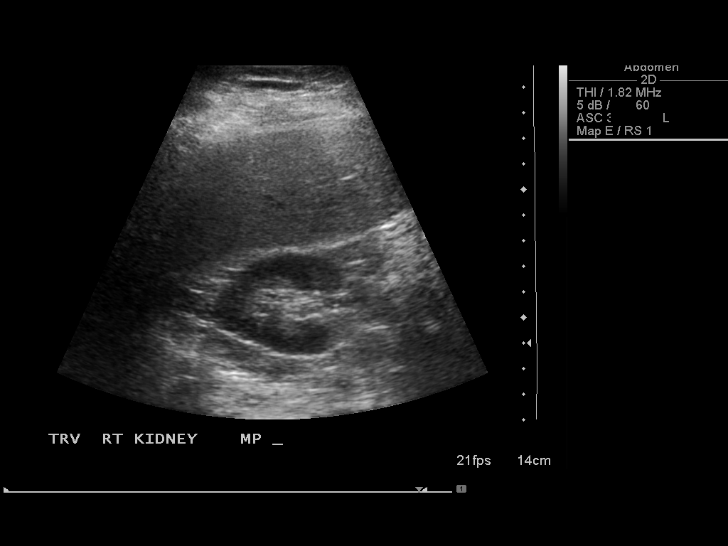
[im 21/61]
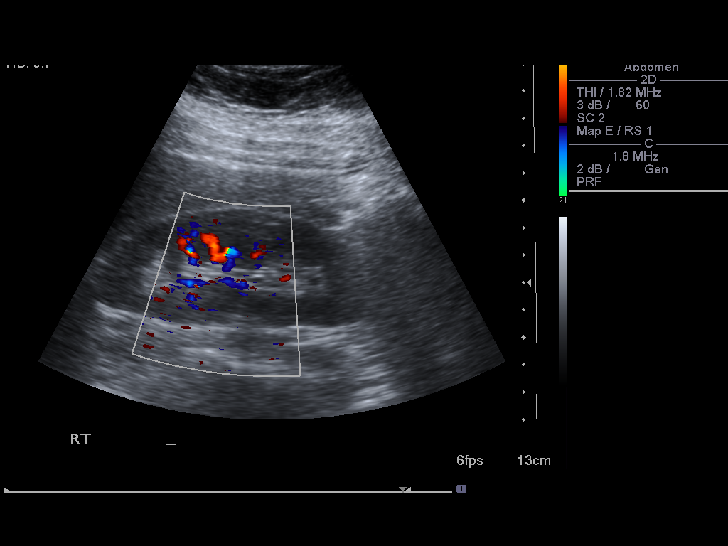
[im 23/61]
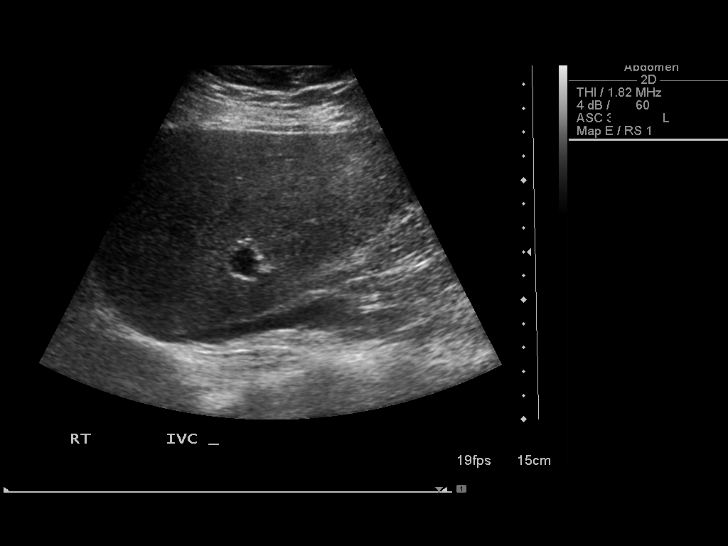
[im 28/61]
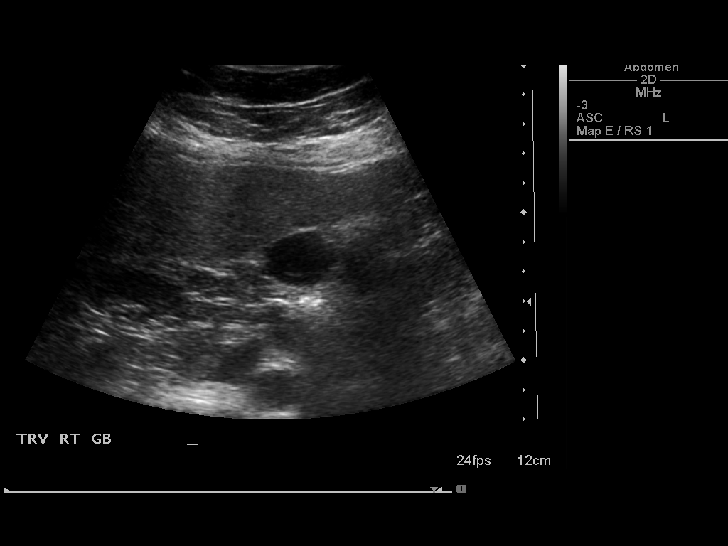
[im 33/61]
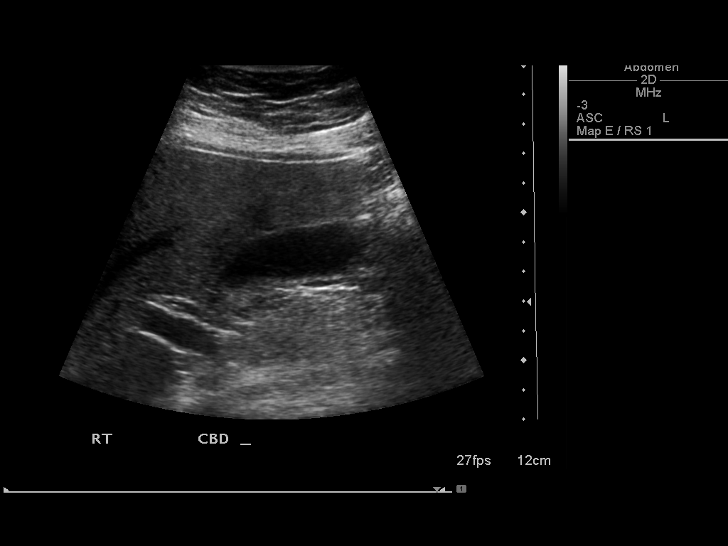
[im 38/61]
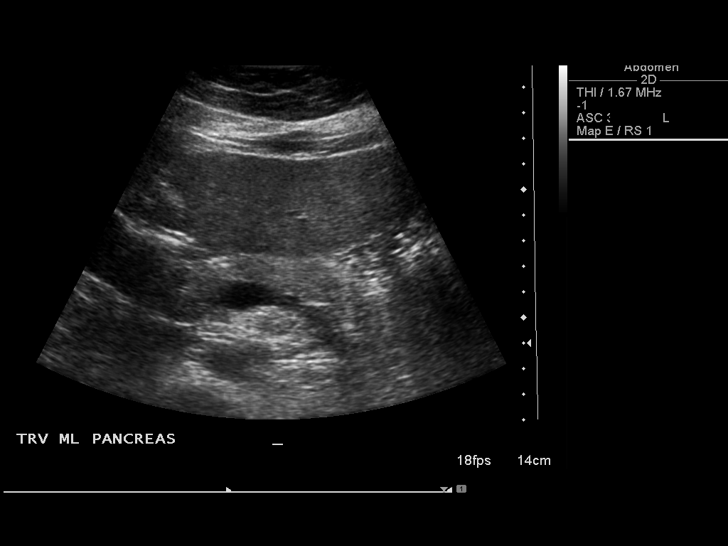
[im 41/61]
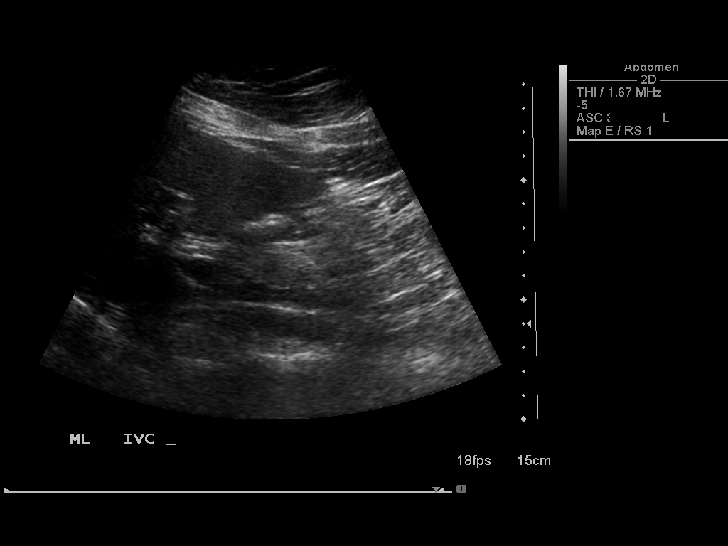
[im 46/61]
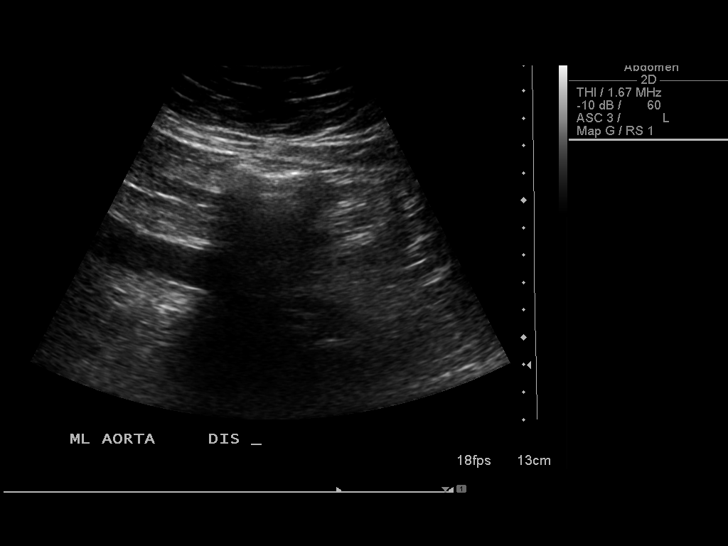
[im 51/61]
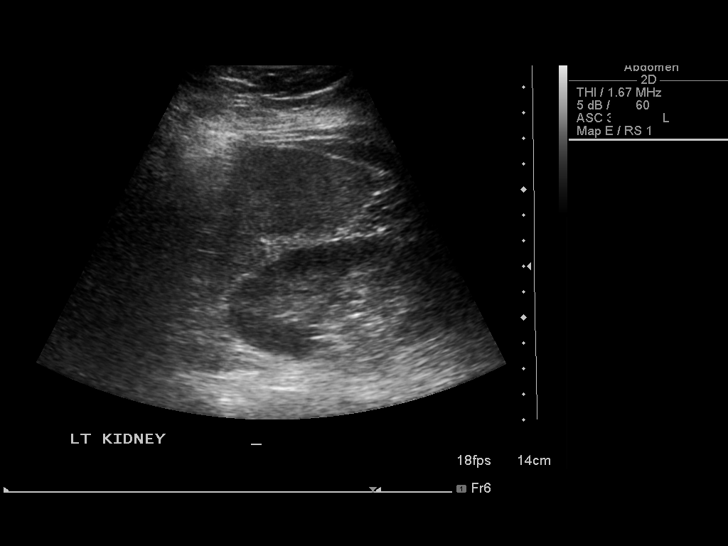
[im 56/61]
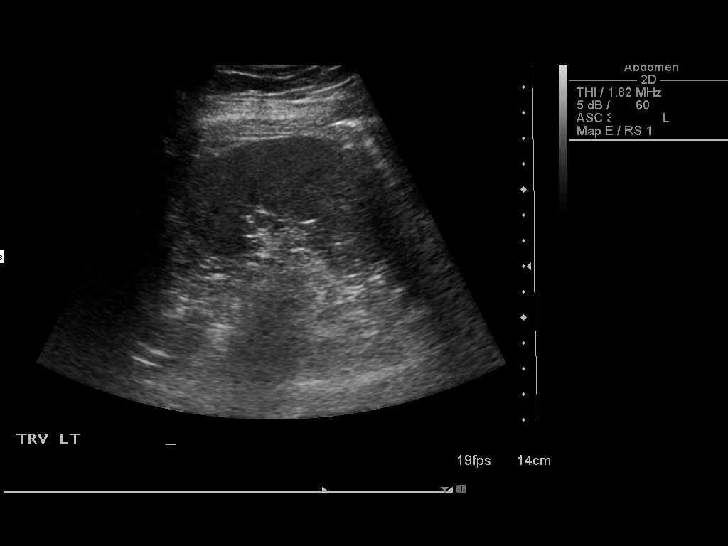
[im 61/61]
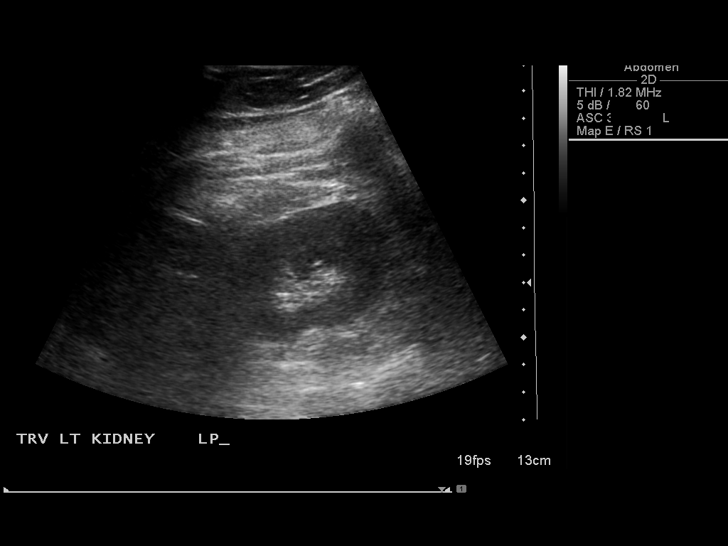

[14 of 25 positions shown; findings below may reference images not displayed]

Gallbladder:  Normally distended without stones or wall thickening.
No pericholecystic fluid or sonographic Murphy sign.

Common bile duct:  Normal caliber 4 mm diameter

Liver:  Coarsened slightly increased echogenicity question fatty
infiltration though this can be seen with cirrhosis and certain
infiltrative disorders.  No focal hepatic mass or nodularity.
Hepatopetal portal venous flow.

IVC:  Normal appearance

Pancreas:  Normal appearance

Spleen:  Normal appearance, 6.8 cm length

Right kidney:  10.9 cm length. Normal morphology without mass or
hydronephrosis.

Left kidney:  11.5 cm length. Normal morphology without mass or
hydronephrosis.

Aorta:  Normal caliber

Other:  No free fluid
IMPRESSION: Question fatty infiltration of liver as above.
Otherwise normal exam.

## 2012-11-30 ENCOUNTER — Encounter: Payer: Self-pay | Admitting: Family Medicine

## 2012-11-30 ENCOUNTER — Ambulatory Visit (INDEPENDENT_AMBULATORY_CARE_PROVIDER_SITE_OTHER): Payer: Managed Care, Other (non HMO) | Admitting: Family Medicine

## 2012-11-30 VITALS — BP 112/78 | HR 76 | Temp 98.1°F | Resp 16 | Wt 158.5 lb

## 2012-11-30 DIAGNOSIS — I10 Essential (primary) hypertension: Secondary | ICD-10-CM

## 2012-11-30 DIAGNOSIS — E785 Hyperlipidemia, unspecified: Secondary | ICD-10-CM

## 2012-11-30 DIAGNOSIS — Z01419 Encounter for gynecological examination (general) (routine) without abnormal findings: Secondary | ICD-10-CM

## 2012-11-30 LAB — HEPATIC FUNCTION PANEL
Albumin: 5 g/dL (ref 3.5–5.2)
Alkaline Phosphatase: 56 U/L (ref 39–117)
Total Bilirubin: 0.6 mg/dL (ref 0.3–1.2)

## 2012-11-30 LAB — CBC WITH DIFFERENTIAL/PLATELET
Basophils Absolute: 0.1 10*3/uL (ref 0.0–0.1)
Eosinophils Absolute: 0.1 10*3/uL (ref 0.0–0.7)
Eosinophils Relative: 1.9 % (ref 0.0–5.0)
HCT: 46.5 % — ABNORMAL HIGH (ref 36.0–46.0)
Lymphs Abs: 2.9 10*3/uL (ref 0.7–4.0)
MCHC: 34 g/dL (ref 30.0–36.0)
MCV: 96.6 fl (ref 78.0–100.0)
Monocytes Absolute: 0.4 10*3/uL (ref 0.1–1.0)
Neutrophils Relative %: 50.6 % (ref 43.0–77.0)
Platelets: 285 10*3/uL (ref 150.0–400.0)
RDW: 12.7 % (ref 11.5–14.6)

## 2012-11-30 LAB — LIPID PANEL
HDL: 38 mg/dL — ABNORMAL LOW (ref >39.00)
LDL Cholesterol: 120 mg/dL — ABNORMAL HIGH (ref 0–99)
Total CHOL/HDL Ratio: 5
VLDL: 30.4 mg/dL (ref 0.0–40.0)

## 2012-11-30 LAB — TSH: TSH: 0.47 u[IU]/mL (ref 0.35–5.50)

## 2012-11-30 LAB — BASIC METABOLIC PANEL
Calcium: 10.2 mg/dL (ref 8.4–10.5)
GFR: 63.19 mL/min (ref >60.00)
Glucose, Bld: 111 mg/dL — ABNORMAL HIGH (ref 70–99)
Potassium: 3.8 mEq/L (ref 3.5–5.1)
Sodium: 138 mEq/L (ref 135–145)

## 2012-11-30 MED ORDER — LISINOPRIL-HYDROCHLOROTHIAZIDE 10-12.5 MG PO TABS: 1.0000 | ORAL_TABLET | Freq: Every day | ORAL | Status: DC

## 2012-11-30 MED ORDER — RANITIDINE HCL 150 MG PO TABS: 150.0000 mg | ORAL_TABLET | Freq: Every day | ORAL | Status: DC

## 2012-11-30 NOTE — Progress Notes (Signed)
  Subjective:    Patient ID: Rachel Scott, female    DOB: 1965/05/31, 47 y.o.   MRN: [id number]  HPI HTN- chronic problem, on Lisinopril HCTZ.  Excellent control.  No CP, SOB, HAs, visual changes, edema.  Hyperlipidemia- due for cholesterol labs.  Not currently on meds.  No N/V/D.  No myalgias.  Tobacco use- quit smoking!!  Is on last week of patch.  Feels good.   Review of Systems For ROS see HPI     Objective:   Physical Exam  Vitals reviewed. Constitutional: She is oriented to person, place, and time. She appears well-developed and well-nourished. No distress.  HENT:  Head: Normocephalic and atraumatic.  Eyes: Conjunctivae and EOM are normal. Pupils are equal, round, and reactive to light.  Neck: Normal range of motion. Neck supple. No thyromegaly present.  Cardiovascular: Normal rate, regular rhythm, normal heart sounds and intact distal pulses.   No murmur heard. Pulmonary/Chest: Effort normal and breath sounds normal. No respiratory distress.  Abdominal: Soft. She exhibits no distension. There is no tenderness.  Musculoskeletal: She exhibits no edema.  Lymphadenopathy:    She has no cervical adenopathy.  Neurological: She is alert and oriented to person, place, and time.  Skin: Skin is warm and dry.  Psychiatric: She has a normal mood and affect. Her behavior is normal.          Assessment & Plan:

## 2012-11-30 NOTE — Patient Instructions (Signed)
Schedule your complete physical in 6 months We'll notify you of your lab results and make any changes if needed Keep up the good work!  You look great! Happy Early [place] Ouch! CONGRATS on QUITTING SMOKING!!!

## 2012-11-30 NOTE — Assessment & Plan Note (Signed)
Noted on last labs.  Not currently on meds.  Check labs.  Start meds prn. 

## 2012-11-30 NOTE — Assessment & Plan Note (Signed)
Chronic problem, excellent control.  Asymptomatic.  Check labs.  No anticipated med changes. 

## 2013-02-21 LAB — HM MAMMOGRAPHY: HM Mammogram: NORMAL

## 2013-05-30 ENCOUNTER — Telehealth: Payer: Self-pay

## 2013-05-30 NOTE — Telephone Encounter (Signed)
Left message for call back Non-identifiable   Pap- hx. Hysterectomy MMG Flu Td

## 2013-05-31 ENCOUNTER — Encounter: Payer: Self-pay | Admitting: Family Medicine

## 2013-05-31 ENCOUNTER — Ambulatory Visit: Payer: Managed Care, Other (non HMO)

## 2013-05-31 ENCOUNTER — Ambulatory Visit (INDEPENDENT_AMBULATORY_CARE_PROVIDER_SITE_OTHER): Payer: Managed Care, Other (non HMO) | Admitting: Family Medicine

## 2013-05-31 VITALS — BP 106/72 | HR 63 | Temp 98.2°F | Resp 16 | Ht 60.25 in | Wt 149.2 lb

## 2013-05-31 DIAGNOSIS — Z0000 Encounter for general adult medical examination without abnormal findings: Secondary | ICD-10-CM

## 2013-05-31 DIAGNOSIS — R946 Abnormal results of thyroid function studies: Secondary | ICD-10-CM

## 2013-05-31 DIAGNOSIS — F341 Dysthymic disorder: Secondary | ICD-10-CM

## 2013-05-31 DIAGNOSIS — F418 Other specified anxiety disorders: Secondary | ICD-10-CM

## 2013-05-31 LAB — CBC WITH DIFFERENTIAL/PLATELET
Basophils Absolute: 0.1 10*3/uL (ref 0.0–0.1)
Basophils Relative: 1 % (ref 0.0–3.0)
EOS PCT: 1.4 % (ref 0.0–5.0)
Eosinophils Absolute: 0.1 10*3/uL (ref 0.0–0.7)
HCT: 47.7 % — ABNORMAL HIGH (ref 36.0–46.0)
Hemoglobin: 16.3 g/dL — ABNORMAL HIGH (ref 12.0–15.0)
LYMPHS PCT: 44.5 % (ref 12.0–46.0)
Lymphs Abs: 3.3 10*3/uL (ref 0.7–4.0)
MCHC: 34.2 g/dL (ref 30.0–36.0)
MCV: 100.5 fl — ABNORMAL HIGH (ref 78.0–100.0)
MONOS PCT: 7.1 % (ref 3.0–12.0)
Monocytes Absolute: 0.5 10*3/uL (ref 0.1–1.0)
NEUTROS PCT: 46 % (ref 43.0–77.0)
Neutro Abs: 3.4 10*3/uL (ref 1.4–7.7)
Platelets: 279 10*3/uL (ref 150.0–400.0)
RBC: 4.75 Mil/uL (ref 3.87–5.11)
RDW: 13.8 % (ref 11.5–14.6)
WBC: 7.4 10*3/uL (ref 4.5–10.5)

## 2013-05-31 LAB — BASIC METABOLIC PANEL
BUN: 10 mg/dL (ref 6–23)
CALCIUM: 10 mg/dL (ref 8.4–10.5)
CO2: 26 mEq/L (ref 19–32)
Chloride: 97 mEq/L (ref 96–112)
Creatinine, Ser: 0.9 mg/dL (ref 0.4–1.2)
GFR: 76.06 mL/min (ref >60.00)
GLUCOSE: 102 mg/dL — AB (ref 70–99)
Potassium: 3.6 mEq/L (ref 3.5–5.1)
Sodium: 133 mEq/L — ABNORMAL LOW (ref 135–145)

## 2013-05-31 LAB — LIPID PANEL
CHOL/HDL RATIO: 3
Cholesterol: 156 mg/dL (ref 0–200)
HDL: 46.5 mg/dL (ref >39.00)
LDL Cholesterol: 94 mg/dL (ref 0–99)
TRIGLYCERIDES: 78 mg/dL (ref 0.0–149.0)
VLDL: 15.6 mg/dL (ref 0.0–40.0)

## 2013-05-31 LAB — HEPATIC FUNCTION PANEL
ALT: 59 U/L — AB (ref 0–35)
AST: 43 U/L — ABNORMAL HIGH (ref 0–37)
Albumin: 4.8 g/dL (ref 3.5–5.2)
Alkaline Phosphatase: 63 U/L (ref 39–117)
BILIRUBIN TOTAL: 0.8 mg/dL (ref 0.3–1.2)
Bilirubin, Direct: 0.1 mg/dL (ref 0.0–0.3)
TOTAL PROTEIN: 7.8 g/dL (ref 6.0–8.3)

## 2013-05-31 LAB — TSH: TSH: 0.29 u[IU]/mL — ABNORMAL LOW (ref 0.35–5.50)

## 2013-05-31 LAB — T4, FREE: FREE T4: 1.25 ng/dL (ref 0.60–1.60)

## 2013-05-31 LAB — T3, FREE: T3, Free: 3.3 pg/mL (ref 2.3–4.2)

## 2013-05-31 MED ORDER — FLUOXETINE HCL 20 MG PO TABS: 20.0000 mg | ORAL_TABLET | Freq: Every day | ORAL | Status: DC

## 2013-05-31 NOTE — Assessment & Plan Note (Signed)
Pt's PE WNL.  No need for pap due to hysterectomy.  Overdue on mammo- pt encouraged to schedule.  Check labs.  Anticipatory guidance provided.

## 2013-05-31 NOTE — Assessment & Plan Note (Signed)
New.  Pt reports increased sadness, tearfulness, very quick to anger.  Denies SI/HI, able to contract for safety.  Has not tolerated Wellbutrin well in the past.  Start low dose prozac and monitor for improvement.  Will follow closely.

## 2013-05-31 NOTE — Patient Instructions (Signed)
Follow up in 4-6 weeks to recheck mood Start the Prozac daily for anxiety/depression We'll notify you of your lab results and make any changes if needed Schedule your mammo at your convenience Call with any questions or concerns Keep up the good work! Hang in there!

## 2013-05-31 NOTE — Progress Notes (Signed)
   Subjective:    Patient ID: Rachel Scott, female    DOB: 17-Jan-1966, 48 y.o.   MRN: [id number]  HPI CPE- no concerns today.  Due for mammo.   Review of Systems Patient reports no vision/ hearing changes, adenopathy,fever, weight change,  persistant/recurrent hoarseness , swallowing issues, chest pain, palpitations, edema, persistant/recurrent cough, hemoptysis, dyspnea (rest/exertional/paroxysmal nocturnal), gastrointestinal bleeding (melena, rectal bleeding), abdominal pain, significant heartburn, bowel changes, GU symptoms (dysuria, hematuria, incontinence), Gyn symptoms (abnormal  bleeding, pain),  syncope, focal weakness, memory loss, numbness & tingling, skin/hair/nail changes, abnormal bruising or bleeding.  + anxiety/depression- difficult year for pt, had to have all teeth pulled, quit smoking, work stress.  Did not tolerate Wellbutrin in the past b/c it made pt quick to anger.     Objective:   Physical Exam General Appearance:    Alert, cooperative, no distress, appears stated age  Head:    Normocephalic, without obvious abnormality, atraumatic  Eyes:    PERRL, conjunctiva/corneas clear, EOM's intact, fundi    benign, both eyes  Ears:    Normal TM's and external ear canals, both ears  Nose:   Nares normal, septum midline, mucosa normal, no drainage    or sinus tenderness  Throat:   Lips, mucosa, and tongue normal; teeth and gums normal  Neck:   Supple, symmetrical, trachea midline, no adenopathy;    Thyroid: no enlargement/tenderness/nodules  Back:     Symmetric, no curvature, ROM normal, no CVA tenderness  Lungs:     Clear to auscultation bilaterally, respirations unlabored  Chest Wall:    No tenderness or deformity   Heart:    Regular rate and rhythm, S1 and S2 normal, no murmur, rub   or gallop  Breast Exam:    Deferred to mammo  Abdomen:     Soft, non-tender, bowel sounds active all four quadrants,    no masses, no organomegaly  Genitalia:    Deferred to GYN    Rectal:    Extremities:   Extremities normal, atraumatic, no cyanosis or edema  Pulses:   2+ and symmetric all extremities  Skin:   Skin color, texture, turgor normal, no rashes or lesions  Lymph nodes:   Cervical, supraclavicular, and axillary nodes normal  Neurologic:   CNII-XII intact, normal strength, sensation and reflexes    throughout          Assessment & Plan:

## 2013-05-31 NOTE — Progress Notes (Signed)
Pre visit review using our clinic review tool, if applicable. No additional management support is needed unless otherwise documented below in the visit note. 

## 2013-06-03 ENCOUNTER — Telehealth: Payer: Self-pay | Admitting: Family Medicine

## 2013-06-03 MED ORDER — FLUOXETINE HCL 20 MG PO CAPS: 20.0000 mg | ORAL_CAPSULE | Freq: Every day | ORAL | Status: DC

## 2013-06-03 NOTE — Telephone Encounter (Signed)
Unable to reach pre visit.  

## 2013-06-03 NOTE — Telephone Encounter (Signed)
Patient called and states that her RX Prozac she would like for it to be sent in as capsules instead of tablets. Patient states that it is much cheaper in capsules. Please advise.

## 2013-06-03 NOTE — Telephone Encounter (Signed)
Med filled and pt notified.  

## 2013-06-09 LAB — VITAMIN D 1,25 DIHYDROXY
VITAMIN D3 1, 25 (OH): 55 pg/mL
Vitamin D 1, 25 (OH)2 Total: 55 pg/mL (ref 18–72)

## 2013-07-02 ENCOUNTER — Ambulatory Visit (INDEPENDENT_AMBULATORY_CARE_PROVIDER_SITE_OTHER): Payer: Managed Care, Other (non HMO) | Admitting: Family Medicine

## 2013-07-02 ENCOUNTER — Encounter: Payer: Self-pay | Admitting: Family Medicine

## 2013-07-02 VITALS — BP 102/76 | HR 63 | Temp 98.2°F | Resp 16 | Wt 151.4 lb

## 2013-07-02 DIAGNOSIS — F341 Dysthymic disorder: Secondary | ICD-10-CM

## 2013-07-02 DIAGNOSIS — I10 Essential (primary) hypertension: Secondary | ICD-10-CM

## 2013-07-02 DIAGNOSIS — F418 Other specified anxiety disorders: Secondary | ICD-10-CM

## 2013-07-02 MED ORDER — FLUOXETINE HCL 40 MG PO CAPS: 40.0000 mg | ORAL_CAPSULE | Freq: Every day | ORAL | Status: DC

## 2013-07-02 NOTE — Progress Notes (Signed)
Pre visit review using our clinic review tool, if applicable. No additional management support is needed unless otherwise documented below in the visit note. 

## 2013-07-02 NOTE — Progress Notes (Signed)
   Subjective:    Patient ID: Rachel Scott, female    DOB: 1965/02/24, 48 y.o.   MRN: [id number]  HPI Depression- pt started on Prozac at last visit.  Pt drinking 3-4 beers daily.  Pt feels medication is working.  Less quick to anger, less tearful, sleeping better.  Pt would like to increase dose for even more symptom relief.  HTN- chronic problem, excellent control.  Has quit smoking, anxiety is much better controlled.   Review of Systems For ROS see HPI     Objective:   Physical Exam  Vitals reviewed. Constitutional: She is oriented to person, place, and time. She appears well-developed and well-nourished. No distress.  HENT:  Head: Normocephalic and atraumatic.  Eyes: Conjunctivae and EOM are normal. Pupils are equal, round, and reactive to light.  Neck: Normal range of motion. Neck supple. No thyromegaly present.  Cardiovascular: Normal rate, regular rhythm, normal heart sounds and intact distal pulses.   No murmur heard. Pulmonary/Chest: Effort normal and breath sounds normal. No respiratory distress.  Abdominal: Soft. She exhibits no distension. There is no tenderness.  Musculoskeletal: She exhibits no edema.  Lymphadenopathy:    She has no cervical adenopathy.  Neurological: She is alert and oriented to person, place, and time.  Skin: Skin is warm and dry.  Psychiatric: She has a normal mood and affect. Her behavior is normal.          Assessment & Plan:

## 2013-07-02 NOTE — Assessment & Plan Note (Signed)
Much improved.  Will increase prozac to 40mg  for even better symptom control.  Will follow.

## 2013-07-02 NOTE — Assessment & Plan Note (Signed)
Excellent control.  Discussed stopping Lisinopril and continuing the HCTZ but pt would like to hold off on med changes at this time b/c she fears that she will gain all of her weight back.  Will follow.

## 2013-07-02 NOTE — Patient Instructions (Signed)
Recheck BP in 3 months and we'll determine our next steps in medication Increase the Prozac to 40mg  daily- 2 of what you have at home and 1 of the new prescription Call with any questions or concerns Keep up the good work!  You look great!!!

## 2013-07-03 ENCOUNTER — Telehealth: Payer: Self-pay | Admitting: Family Medicine

## 2013-07-03 NOTE — Telephone Encounter (Signed)
Relevant patient education assigned to patient using Emmi. ° °

## 2013-07-24 ENCOUNTER — Telehealth: Payer: Self-pay

## 2013-07-24 NOTE — Telephone Encounter (Signed)
[facility] called inquiring about the patient's current prescription for Fluoxetine.  According to the patient's recent medication list and Dr. Virgil Benedict office note on 07/02/13, patient's Fluoxetine has been increased to 40 mg.  The same was reported to the pharmacy tech.  No further questions or concerns were addressed during this call.

## 2013-09-23 ENCOUNTER — Other Ambulatory Visit: Payer: Self-pay | Admitting: General Practice

## 2013-09-23 DIAGNOSIS — I10 Essential (primary) hypertension: Secondary | ICD-10-CM

## 2013-09-23 MED ORDER — LISINOPRIL-HYDROCHLOROTHIAZIDE 10-12.5 MG PO TABS: 1.0000 | ORAL_TABLET | Freq: Every day | ORAL | Status: DC

## 2013-10-01 ENCOUNTER — Encounter: Payer: Self-pay | Admitting: Family Medicine

## 2013-10-01 ENCOUNTER — Ambulatory Visit (INDEPENDENT_AMBULATORY_CARE_PROVIDER_SITE_OTHER): Payer: Managed Care, Other (non HMO) | Admitting: Family Medicine

## 2013-10-01 VITALS — BP 122/80 | HR 78 | Temp 98.0°F | Resp 16 | Wt 159.5 lb

## 2013-10-01 DIAGNOSIS — F418 Other specified anxiety disorders: Secondary | ICD-10-CM

## 2013-10-01 DIAGNOSIS — F341 Dysthymic disorder: Secondary | ICD-10-CM

## 2013-10-01 DIAGNOSIS — I10 Essential (primary) hypertension: Secondary | ICD-10-CM

## 2013-10-01 MED ORDER — LISINOPRIL-HYDROCHLOROTHIAZIDE 10-12.5 MG PO TABS: 1.0000 | ORAL_TABLET | Freq: Every day | ORAL | Status: DC

## 2013-10-01 MED ORDER — FLUOXETINE HCL 40 MG PO CAPS: 40.0000 mg | ORAL_CAPSULE | Freq: Every day | ORAL | Status: DC

## 2013-10-01 MED ORDER — RANITIDINE HCL 150 MG PO TABS: 150.0000 mg | ORAL_TABLET | Freq: Every day | ORAL | Status: DC

## 2013-10-01 NOTE — Progress Notes (Signed)
Pre visit review using our clinic review tool, if applicable. No additional management support is needed unless otherwise documented below in the visit note. 

## 2013-10-01 NOTE — Assessment & Plan Note (Signed)
Chronic problem.  Well controlled.  Asymptomatic.  No changes at this time.

## 2013-10-01 NOTE — Progress Notes (Signed)
   Subjective:    Patient ID: Rachel Scott, female    DOB: 02/23/1965, 48 y.o.   MRN: [id number]  HPI HTN- chronic problem, on Lisinopril HCTZ daily.  No CP, SOB, HAs, visual changes, edema.  Depression- ongoing issue for pt.  Now on Prozac 40mg .  Having increased bruising since starting medication.  People are noticing that pt is taking risks- pt feels less inhibited and 'this is a good thing'.  Pt reports she is ending her relationship of 6 yrs b/c 'i deserve better'.  Denies lack of sleep or excessive energy   Review of Systems For ROS see HPI     Objective:   Physical Exam  Vitals reviewed. Constitutional: She is oriented to person, place, and time. She appears well-developed and well-nourished. No distress.  HENT:  Head: Normocephalic and atraumatic.  Eyes: Conjunctivae and EOM are normal. Pupils are equal, round, and reactive to light.  Neck: Normal range of motion. Neck supple. No thyromegaly present.  Cardiovascular: Normal rate, regular rhythm, normal heart sounds and intact distal pulses.   No murmur heard. Pulmonary/Chest: Effort normal and breath sounds normal. No respiratory distress.  Abdominal: Soft. She exhibits no distension. There is no tenderness.  Musculoskeletal: She exhibits no edema.  Lymphadenopathy:    She has no cervical adenopathy.  Neurological: She is alert and oriented to person, place, and time.  Skin: Skin is warm and dry.  Psychiatric: She has a normal mood and affect. Her behavior is normal.          Assessment & Plan:

## 2013-10-01 NOTE — Patient Instructions (Signed)
Follow up in 3 months to recheck mood, BP and cholesterol No med changes at this time If you talk to your friends and family and they feel there has been a concerning change, let me know Call with any questions or concerns Enjoy the rest of your summer!!!

## 2013-10-01 NOTE — Assessment & Plan Note (Signed)
Improved since increasing Prozac.  Pt feels that her partner (with whom she is breaking up) is blaming everything on the medication.  Pt has not had any negative feedback from others around her (family, work, friends) but has not directly asked.  Pt plans to do so.  No obvious sxs of mania but will follow.

## 2013-11-01 ENCOUNTER — Ambulatory Visit (INDEPENDENT_AMBULATORY_CARE_PROVIDER_SITE_OTHER): Payer: Managed Care, Other (non HMO) | Admitting: Psychology

## 2013-11-01 DIAGNOSIS — F4323 Adjustment disorder with mixed anxiety and depressed mood: Secondary | ICD-10-CM

## 2013-11-15 ENCOUNTER — Ambulatory Visit (INDEPENDENT_AMBULATORY_CARE_PROVIDER_SITE_OTHER): Payer: Managed Care, Other (non HMO) | Admitting: Psychology

## 2013-11-15 DIAGNOSIS — F4323 Adjustment disorder with mixed anxiety and depressed mood: Secondary | ICD-10-CM

## 2013-11-29 ENCOUNTER — Ambulatory Visit: Payer: Managed Care, Other (non HMO) | Admitting: Psychology

## 2013-12-04 ENCOUNTER — Ambulatory Visit (INDEPENDENT_AMBULATORY_CARE_PROVIDER_SITE_OTHER): Payer: Managed Care, Other (non HMO) | Admitting: Psychology

## 2013-12-04 DIAGNOSIS — F4323 Adjustment disorder with mixed anxiety and depressed mood: Secondary | ICD-10-CM

## 2013-12-13 ENCOUNTER — Other Ambulatory Visit: Payer: Self-pay | Admitting: Family Medicine

## 2013-12-13 MED ORDER — FLUOXETINE HCL 40 MG PO CAPS: 40.0000 mg | ORAL_CAPSULE | Freq: Every day | ORAL | Status: DC

## 2013-12-13 NOTE — Telephone Encounter (Signed)
Med filled.  

## 2013-12-18 ENCOUNTER — Ambulatory Visit (INDEPENDENT_AMBULATORY_CARE_PROVIDER_SITE_OTHER): Payer: Managed Care, Other (non HMO) | Admitting: Psychology

## 2013-12-18 DIAGNOSIS — F4323 Adjustment disorder with mixed anxiety and depressed mood: Secondary | ICD-10-CM

## 2013-12-30 ENCOUNTER — Ambulatory Visit (INDEPENDENT_AMBULATORY_CARE_PROVIDER_SITE_OTHER): Payer: Managed Care, Other (non HMO) | Admitting: Psychology

## 2013-12-30 DIAGNOSIS — F4323 Adjustment disorder with mixed anxiety and depressed mood: Secondary | ICD-10-CM

## 2014-01-06 ENCOUNTER — Ambulatory Visit: Payer: Managed Care, Other (non HMO) | Admitting: Family Medicine

## 2014-01-06 ENCOUNTER — Telehealth: Payer: Self-pay | Admitting: *Deleted

## 2014-01-06 NOTE — Telephone Encounter (Signed)
Pt needs no-show fee 

## 2014-01-06 NOTE — Telephone Encounter (Signed)
See note below

## 2014-01-06 NOTE — Telephone Encounter (Signed)
Pt did not show for appointment 01/06/2014 at 9:30am for 3 month follow up

## 2014-01-20 ENCOUNTER — Ambulatory Visit: Payer: Managed Care, Other (non HMO) | Admitting: Psychology

## 2014-05-26 ENCOUNTER — Encounter: Payer: Self-pay | Admitting: General Practice

## 2014-05-26 ENCOUNTER — Other Ambulatory Visit: Payer: Self-pay | Admitting: Family Medicine

## 2014-05-26 MED ORDER — FLUOXETINE HCL 40 MG PO CAPS: 40.0000 mg | ORAL_CAPSULE | Freq: Every day | ORAL | Status: DC

## 2014-05-26 NOTE — Telephone Encounter (Signed)
Med filled and letter mailed to pt to schedule a bp follow up.

## 2014-09-14 ENCOUNTER — Other Ambulatory Visit: Payer: Self-pay | Admitting: Family Medicine

## 2014-09-15 ENCOUNTER — Other Ambulatory Visit: Payer: Self-pay

## 2014-09-15 MED ORDER — FLUOXETINE HCL 40 MG PO CAPS: 40.0000 mg | ORAL_CAPSULE | Freq: Every day | ORAL | Status: DC

## 2014-09-26 ENCOUNTER — Encounter: Payer: Self-pay | Admitting: Family Medicine

## 2014-09-26 ENCOUNTER — Other Ambulatory Visit: Payer: Self-pay | Admitting: Family Medicine

## 2014-09-26 ENCOUNTER — Ambulatory Visit (INDEPENDENT_AMBULATORY_CARE_PROVIDER_SITE_OTHER): Payer: BLUE CROSS/BLUE SHIELD | Admitting: Family Medicine

## 2014-09-26 VITALS — BP 108/70 | HR 73 | Temp 98.6°F | Ht 60.0 in | Wt 173.0 lb

## 2014-09-26 DIAGNOSIS — I10 Essential (primary) hypertension: Secondary | ICD-10-CM | POA: Diagnosis not present

## 2014-09-26 DIAGNOSIS — R7401 Elevation of levels of liver transaminase levels: Secondary | ICD-10-CM

## 2014-09-26 DIAGNOSIS — E785 Hyperlipidemia, unspecified: Secondary | ICD-10-CM

## 2014-09-26 DIAGNOSIS — R740 Nonspecific elevation of levels of transaminase and lactic acid dehydrogenase [LDH]: Principal | ICD-10-CM

## 2014-09-26 DIAGNOSIS — F418 Other specified anxiety disorders: Secondary | ICD-10-CM | POA: Diagnosis not present

## 2014-09-26 LAB — CBC WITH DIFFERENTIAL/PLATELET
BASOS PCT: 0.5 % (ref 0.0–3.0)
Basophils Absolute: 0 10*3/uL (ref 0.0–0.1)
EOS PCT: 1.3 % (ref 0.0–5.0)
Eosinophils Absolute: 0.1 10*3/uL (ref 0.0–0.7)
HEMATOCRIT: 49.5 % — AB (ref 36.0–46.0)
HEMOGLOBIN: 17 g/dL — AB (ref 12.0–15.0)
LYMPHS PCT: 36.7 % (ref 12.0–46.0)
Lymphs Abs: 3.4 10*3/uL (ref 0.7–4.0)
MCHC: 34.3 g/dL (ref 30.0–36.0)
MCV: 105.4 fl — ABNORMAL HIGH (ref 78.0–100.0)
MONO ABS: 0.5 10*3/uL (ref 0.1–1.0)
Monocytes Relative: 5.3 % (ref 3.0–12.0)
NEUTROS PCT: 56.2 % (ref 43.0–77.0)
Neutro Abs: 5.3 10*3/uL (ref 1.4–7.7)
Platelets: 196 10*3/uL (ref 150.0–400.0)
RBC: 4.69 Mil/uL (ref 3.87–5.11)
RDW: 13.8 % (ref 11.5–15.5)
WBC: 9.4 10*3/uL (ref 4.0–10.5)

## 2014-09-26 LAB — BASIC METABOLIC PANEL
BUN: 9 mg/dL (ref 6–23)
CALCIUM: 9.6 mg/dL (ref 8.4–10.5)
CHLORIDE: 98 meq/L (ref 96–112)
CO2: 27 meq/L (ref 19–32)
Creatinine, Ser: 0.81 mg/dL (ref 0.40–1.20)
GFR: 79.96 mL/min (ref >60.00)
GLUCOSE: 93 mg/dL (ref 70–99)
Potassium: 4.2 mEq/L (ref 3.5–5.1)
Sodium: 134 mEq/L — ABNORMAL LOW (ref 135–145)

## 2014-09-26 LAB — HEPATIC FUNCTION PANEL
ALBUMIN: 4.4 g/dL (ref 3.5–5.2)
ALK PHOS: 67 U/L (ref 39–117)
ALT: 89 U/L — AB (ref 0–35)
AST: 69 U/L — ABNORMAL HIGH (ref 0–37)
Bilirubin, Direct: 0.2 mg/dL (ref 0.0–0.3)
TOTAL PROTEIN: 7.1 g/dL (ref 6.0–8.3)
Total Bilirubin: 0.9 mg/dL (ref 0.2–1.2)

## 2014-09-26 LAB — LIPID PANEL
CHOLESTEROL: 173 mg/dL (ref 0–200)
HDL: 34.5 mg/dL — ABNORMAL LOW (ref >39.00)
LDL CALC: 117 mg/dL — AB (ref 0–99)
NonHDL: 138.14
Total CHOL/HDL Ratio: 5
Triglycerides: 107 mg/dL (ref 0.0–149.0)
VLDL: 21.4 mg/dL (ref 0.0–40.0)

## 2014-09-26 LAB — TSH: TSH: 1.04 u[IU]/mL (ref 0.35–4.50)

## 2014-09-26 MED ORDER — RANITIDINE HCL 150 MG PO TABS: 150.0000 mg | ORAL_TABLET | Freq: Every day | ORAL | Status: DC

## 2014-09-26 MED ORDER — FLUOXETINE HCL 40 MG PO CAPS: 40.0000 mg | ORAL_CAPSULE | Freq: Every day | ORAL | Status: DC

## 2014-09-26 MED ORDER — LISINOPRIL-HYDROCHLOROTHIAZIDE 10-12.5 MG PO TABS: 1.0000 | ORAL_TABLET | Freq: Every day | ORAL | Status: DC

## 2014-09-26 NOTE — Progress Notes (Signed)
Pre visit review using our clinic review tool, if applicable. No additional management support is needed unless otherwise documented below in the visit note. 

## 2014-09-26 NOTE — Patient Instructions (Signed)
Schedule your complete physical in 6 months We'll notify you of your lab results and make any changes if needed Try and make healthy food choices and get regular exercise Call with any questions or concerns Enjoy the rest of your summer!!!

## 2014-09-26 NOTE — Progress Notes (Signed)
   Subjective:    Patient ID: Rachel Scott, female    DOB: 1965/12/03, 49 y.o.   MRN: [id number]  HPI HTN- chronic problem, on Lisinopril HCTZ daily.  BP well controlled.  No CP, SOB, HAs, visual changes, edema  Hyperlipidemia- chronic problem, working to control w/ healthy diet and regular exercise.  Has gained 13 lbs.  Not exercising at this time.  Depression- chronic problem, on Prozac.  'it's awesome'.  Denies short temper.  Feeling much better.   Review of Systems For ROS see HPI     Objective:   Physical Exam  Constitutional: She is oriented to person, place, and time. She appears well-developed and well-nourished. No distress.  HENT:  Head: Normocephalic and atraumatic.  Eyes: Conjunctivae and EOM are normal. Pupils are equal, round, and reactive to light.  Neck: Normal range of motion. Neck supple. No thyromegaly present.  Cardiovascular: Normal rate, regular rhythm, normal heart sounds and intact distal pulses.   No murmur heard. Pulmonary/Chest: Effort normal and breath sounds normal. No respiratory distress.  Abdominal: Soft. She exhibits no distension. There is no tenderness.  Musculoskeletal: She exhibits no edema.  Lymphadenopathy:    She has no cervical adenopathy.  Neurological: She is alert and oriented to person, place, and time.  Skin: Skin is warm and dry.  Psychiatric: She has a normal mood and affect. Her behavior is normal.  Vitals reviewed.         Assessment & Plan:

## 2014-09-28 NOTE — Assessment & Plan Note (Signed)
Chronic problem.  Well controlled on prozac.  No med changes.  Will continue to follow.

## 2014-09-28 NOTE — Assessment & Plan Note (Signed)
Chronic problem. Well controlled on Lisinopril HCTZ.  Asymptomatic.  Check labs.  No anticipated med changes.

## 2014-09-28 NOTE — Assessment & Plan Note (Signed)
Chronic problem.  Pt has been attempting to control w/ healthy diet and regular exercise but admits to no exercise and poor dietary habits.  Check labs.  Start meds prn.

## 2015-03-27 ENCOUNTER — Telehealth: Payer: Self-pay | Admitting: *Deleted

## 2015-03-27 ENCOUNTER — Encounter: Payer: Self-pay | Admitting: *Deleted

## 2015-03-27 NOTE — Telephone Encounter (Signed)
Dr. Darene Lamer, see pt concerns under Specialty Comments in [employer].

## 2015-03-27 NOTE — Telephone Encounter (Signed)
Unable to reach patient at time of pre-visit call. Left message for patient to return call when available.  

## 2015-03-27 NOTE — Telephone Encounter (Signed)
Pre-Visit Call completed with patient and chart updated.   Pre-Visit Info documented in Specialty Comments under [employer].    

## 2015-03-27 NOTE — Telephone Encounter (Signed)
Patient returning your call best # [phone] ask for patient directly

## 2015-03-30 ENCOUNTER — Ambulatory Visit (HOSPITAL_BASED_OUTPATIENT_CLINIC_OR_DEPARTMENT_OTHER)
Admission: RE | Admit: 2015-03-30 | Discharge: 2015-03-30 | Disposition: A | Discharge status: Home / Self Care | Payer: Managed Care, Other (non HMO) | Source: Ambulatory Visit | Attending: Family Medicine | Admitting: Family Medicine

## 2015-03-30 ENCOUNTER — Ambulatory Visit (INDEPENDENT_AMBULATORY_CARE_PROVIDER_SITE_OTHER): Payer: Managed Care, Other (non HMO) | Admitting: Family Medicine

## 2015-03-30 ENCOUNTER — Encounter: Payer: Self-pay | Admitting: Family Medicine

## 2015-03-30 VITALS — BP 120/68 | HR 66 | Temp 98.1°F | Resp 16 | Ht 60.0 in | Wt 174.4 lb

## 2015-03-30 DIAGNOSIS — Z1231 Encounter for screening mammogram for malignant neoplasm of breast: Secondary | ICD-10-CM | POA: Diagnosis not present

## 2015-03-30 DIAGNOSIS — Z0000 Encounter for general adult medical examination without abnormal findings: Secondary | ICD-10-CM | POA: Diagnosis not present

## 2015-03-30 LAB — HEPATIC FUNCTION PANEL
ALBUMIN: 4.1 g/dL (ref 3.5–5.2)
ALT: 72 U/L — ABNORMAL HIGH (ref 0–35)
AST: 46 U/L — ABNORMAL HIGH (ref 0–37)
Alkaline Phosphatase: 68 U/L (ref 39–117)
BILIRUBIN DIRECT: 0.1 mg/dL (ref 0.0–0.3)
Total Bilirubin: 0.6 mg/dL (ref 0.2–1.2)
Total Protein: 6.9 g/dL (ref 6.0–8.3)

## 2015-03-30 LAB — CBC WITH DIFFERENTIAL/PLATELET
BASOS ABS: 0 10*3/uL (ref 0.0–0.1)
Basophils Relative: 0.6 % (ref 0.0–3.0)
EOS ABS: 0.1 10*3/uL (ref 0.0–0.7)
Eosinophils Relative: 2.1 % (ref 0.0–5.0)
HEMATOCRIT: 51.3 % — AB (ref 36.0–46.0)
HEMOGLOBIN: 17.5 g/dL — AB (ref 12.0–15.0)
LYMPHS PCT: 31.6 % (ref 12.0–46.0)
Lymphs Abs: 2.2 10*3/uL (ref 0.7–4.0)
MCHC: 34 g/dL (ref 30.0–36.0)
MCV: 103.8 fl — AB (ref 78.0–100.0)
MONOS PCT: 5.5 % (ref 3.0–12.0)
Monocytes Absolute: 0.4 10*3/uL (ref 0.1–1.0)
NEUTROS ABS: 4.2 10*3/uL (ref 1.4–7.7)
Neutrophils Relative %: 60.2 % (ref 43.0–77.0)
Platelets: 166 10*3/uL (ref 150.0–400.0)
RBC: 4.94 Mil/uL (ref 3.87–5.11)
RDW: 13.6 % (ref 11.5–15.5)
WBC: 6.9 10*3/uL (ref 4.0–10.5)

## 2015-03-30 LAB — BASIC METABOLIC PANEL
BUN: 9 mg/dL (ref 6–23)
CO2: 28 mEq/L (ref 19–32)
CREATININE: 0.84 mg/dL (ref 0.40–1.20)
Calcium: 9.7 mg/dL (ref 8.4–10.5)
Chloride: 98 mEq/L (ref 96–112)
GFR: 76.52 mL/min (ref >60.00)
GLUCOSE: 120 mg/dL — AB (ref 70–99)
Potassium: 4.5 mEq/L (ref 3.5–5.1)
SODIUM: 135 meq/L (ref 135–145)

## 2015-03-30 LAB — LIPID PANEL
Cholesterol: 185 mg/dL (ref 0–200)
HDL: 36.7 mg/dL — AB (ref >39.00)
LDL CALC: 129 mg/dL — AB (ref 0–99)
NonHDL: 148.47
Total CHOL/HDL Ratio: 5
Triglycerides: 98 mg/dL (ref 0.0–149.0)
VLDL: 19.6 mg/dL (ref 0.0–40.0)

## 2015-03-30 LAB — TSH: TSH: 1.52 u[IU]/mL (ref 0.35–4.50)

## 2015-03-30 LAB — VITAMIN D 25 HYDROXY (VIT D DEFICIENCY, FRACTURES): VITD: 19.54 ng/mL — ABNORMAL LOW (ref 30.00–100.00)

## 2015-03-30 MED ORDER — NICOTINE 14 MG/24HR TD PT24: 14.0000 mg | MEDICATED_PATCH | Freq: Every day | TRANSDERMAL | Status: DC

## 2015-03-30 NOTE — Progress Notes (Signed)
   Subjective:    Patient ID: Rachel Scott, female    DOB: 06/30/1965, 50 y.o.   MRN: [id number]  HPI CPE- no need for pap due to hysterectomy, due for mammo (downstairs).  Due for colonoscopy next year.   Review of Systems Patient reports no vision/ hearing changes, adenopathy,fever, weight change,  persistant/recurrent hoarseness , swallowing issues, chest pain, palpitations, edema, persistant/recurrent cough, hemoptysis, dyspnea (rest/exertional/paroxysmal nocturnal), gastrointestinal bleeding (melena, rectal bleeding), abdominal pain, significant heartburn, bowel changes, GU symptoms (dysuria, hematuria, incontinence), Gyn symptoms (abnormal  bleeding, pain),  syncope, focal weakness, numbness & tingling, skin/hair/nail changes, abnormal bruising or bleeding, anxiety, or depression.   + memory loss- pt having difficulty w/ names or word finding.  Will also open cabinet doors and not remember what she was looking for or go into rooms and not remember why she went in.  Pt denies increased stress.  Sleeping 6-7 hrs/night.  Pt does report she is being pulled in multiple different directions.    Objective:   Physical Exam General Appearance:    Alert, cooperative, no distress, appears stated age  Head:    Normocephalic, without obvious abnormality, atraumatic  Eyes:    PERRL, conjunctiva/corneas clear, EOM's intact, fundi    benign, both eyes  Ears:    Normal TM's and external ear canals, both ears  Nose:   Nares normal, septum midline, mucosa normal, no drainage    or sinus tenderness  Throat:   Lips, mucosa, and tongue normal; teeth and gums normal  Neck:   Supple, symmetrical, trachea midline, no adenopathy;    Thyroid: no enlargement/tenderness/nodules  Back:     Symmetric, no curvature, ROM normal, no CVA tenderness  Lungs:     Clear to auscultation bilaterally, respirations unlabored  Chest Wall:    No tenderness or deformity   Heart:    Regular rate and rhythm, S1 and S2 normal,  no murmur, rub   or gallop  Breast Exam:    Deferred to mammo  Abdomen:     Soft, non-tender, bowel sounds active all four quadrants,    no masses, no organomegaly  Genitalia:    Deferred  Rectal:    Extremities:   Extremities normal, atraumatic, no cyanosis or edema  Pulses:   2+ and symmetric all extremities  Skin:   Skin color, texture, turgor normal, no rashes or lesions  Lymph nodes:   Cervical, supraclavicular, and axillary nodes normal  Neurologic:   CNII-XII intact, normal strength, sensation and reflexes    throughout          Assessment & Plan:

## 2015-03-30 NOTE — Assessment & Plan Note (Signed)
Pt's PE WNL w/ exception of being overweight.  Due for mammo- order entered.  Check labs.  Anticipatory guidance provided.

## 2015-03-30 NOTE — Progress Notes (Signed)
Pre visit review using our clinic review tool, if applicable. No additional management support is needed unless otherwise documented below in the visit note. 

## 2015-03-30 NOTE — Patient Instructions (Addendum)
Follow up 6 months to recheck BP We'll notify you of your lab results and make any changes if needed Continue to work on healthy diet and regular exercise- you can do it! Use the patch to quit smoking!!  You can do it!!! We'll call you with your mammogram appt Call with any questions or concerns If you want to join [place] at the new [place] office, any scheduled appointments will automatically transfer and we will see you at [address] [place] [address], [place] [address] [facility]) Happy Valentine's Day!!!

## 2015-03-31 ENCOUNTER — Other Ambulatory Visit: Payer: Self-pay | Admitting: Family Medicine

## 2015-03-31 ENCOUNTER — Other Ambulatory Visit: Payer: Self-pay | Admitting: General Practice

## 2015-03-31 DIAGNOSIS — D582 Other hemoglobinopathies: Secondary | ICD-10-CM

## 2015-03-31 MED ORDER — VITAMIN D (ERGOCALCIFEROL) 1.25 MG (50000 UNIT) PO CAPS: 50000.0000 [IU] | ORAL_CAPSULE | ORAL | Status: DC

## 2015-04-01 ENCOUNTER — Other Ambulatory Visit (INDEPENDENT_AMBULATORY_CARE_PROVIDER_SITE_OTHER): Payer: Managed Care, Other (non HMO)

## 2015-04-01 ENCOUNTER — Encounter: Payer: Self-pay | Admitting: Family Medicine

## 2015-04-01 ENCOUNTER — Other Ambulatory Visit: Payer: Self-pay | Admitting: Family Medicine

## 2015-04-01 DIAGNOSIS — R7309 Other abnormal glucose: Secondary | ICD-10-CM | POA: Diagnosis not present

## 2015-04-01 LAB — HEMOGLOBIN A1C: Hgb A1c MFr Bld: 5.4 % (ref 4.6–6.5)

## 2015-04-02 NOTE — Telephone Encounter (Signed)
Medication filled to pharmacy as requested.  And pt advised to continue both Vitamin D medications.

## 2015-04-02 NOTE — Telephone Encounter (Signed)
Medication filled to pharmacy as requested.   

## 2015-04-21 ENCOUNTER — Telehealth: Payer: Self-pay | Admitting: Family Medicine

## 2015-04-21 NOTE — Telephone Encounter (Signed)
LM for pt to call and schedule flu shot or update records. °

## 2015-04-24 ENCOUNTER — Ambulatory Visit (HOSPITAL_BASED_OUTPATIENT_CLINIC_OR_DEPARTMENT_OTHER): Payer: Managed Care, Other (non HMO) | Admitting: Hematology & Oncology

## 2015-04-24 ENCOUNTER — Ambulatory Visit: Payer: Managed Care, Other (non HMO)

## 2015-04-24 ENCOUNTER — Encounter: Payer: Self-pay | Admitting: Hematology & Oncology

## 2015-04-24 ENCOUNTER — Other Ambulatory Visit (HOSPITAL_BASED_OUTPATIENT_CLINIC_OR_DEPARTMENT_OTHER): Payer: Managed Care, Other (non HMO)

## 2015-04-24 VITALS — BP 114/63 | HR 66 | Temp 98.2°F | Resp 14 | Ht 60.0 in | Wt 172.0 lb

## 2015-04-24 DIAGNOSIS — D751 Secondary polycythemia: Secondary | ICD-10-CM

## 2015-04-24 HISTORY — DX: Secondary polycythemia: D75.1

## 2015-04-24 LAB — CBC WITH DIFFERENTIAL (CANCER CENTER ONLY)
BASO#: 0.1 10*3/uL (ref 0.0–0.2)
BASO%: 0.8 % (ref 0.0–2.0)
EOS%: 1.5 % (ref 0.0–7.0)
Eosinophils Absolute: 0.1 10*3/uL (ref 0.0–0.5)
HEMATOCRIT: 49.7 % — AB (ref 34.8–46.6)
HGB: 17.7 g/dL — ABNORMAL HIGH (ref 11.6–15.9)
LYMPH#: 2.4 10*3/uL (ref 0.9–3.3)
LYMPH%: 32.7 % (ref 14.0–48.0)
MCH: 36 pg — ABNORMAL HIGH (ref 26.0–34.0)
MCHC: 35.6 g/dL (ref 32.0–36.0)
MCV: 101 fL (ref 81–101)
MONO#: 0.5 10*3/uL (ref 0.1–0.9)
MONO%: 6.3 % (ref 0.0–13.0)
NEUT#: 4.3 10*3/uL (ref 1.5–6.5)
NEUT%: 58.7 % (ref 39.6–80.0)
Platelets: 152 10*3/uL (ref 145–400)
RBC: 4.91 10*6/uL (ref 3.70–5.32)
RDW: 13.3 % (ref 11.1–15.7)
WBC: 7.3 10*3/uL (ref 3.9–10.0)

## 2015-04-24 LAB — CHCC SATELLITE - SMEAR

## 2015-04-24 NOTE — Progress Notes (Signed)
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 OTHER    New Patient  : My blood is too high.  HPI: Rachel Scott is a very nice 50 year old white female. Actually saw her about 3 years ago. At that point time, I was not sure that there was any hematologic issue that we had to address. She has some erythrocytosis at that point time. I did not think that she had any myeloproliferative process.  Now, her blood count continues to be on the high side. She try to cut back on cigarette use. There's been no change in medications.  She is still working. 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  2 Years ago, her hemoglobin was 15.8. Last month, it was 17.5. Her hematocrit was 51.3. Her MCV was 104.  Her liver functions have been a little bit on the high side. She attributes this to alcohol use.  Back when we saw her in 2013, her erythropoietin level was very low at 3.3.  She does have headaches. She does have some occasional pain in her hands.  She has some fatigue.  She's had no weight loss or weight gain. She's had no surgeries since we last saw her. She does not have a monthly cycle.  Her appetite is okay. She is not a vegetarian.  Overall, her performance status is ECOG 1.   Past Medical History  Diagnosis Date  . Hypertension   . Hyperlipidemia   . Depression   . Erythrocytosis 04/24/2015  :  Past Surgical History  Procedure Laterality Date  . Abdominal hysterectomy    :   Current outpatient prescriptions:  .  aspirin 81 MG tablet, Take 81 mg by mouth daily., Disp: , Rfl:  .  FLUoxetine (PROZAC) 40 MG capsule, TAKE ONE CAPSULE BY MOUTH ONCE DAILY, Disp: 90 capsule, Rfl: 1 .  lisinopril-hydrochlorothiazide (PRINZIDE,ZESTORETIC) 10-12.5 MG tablet, TAKE ONE TABLET BY MOUTH ONCE DAILY, Disp: 90 tablet, Rfl: 1 .  nicotine (NICODERM CQ - DOSED IN MG/24 HOURS) 14  mg/24hr patch, Place 1 patch (14 mg total) onto the skin daily., Disp: 28 patch, Rfl: 3 .  ranitidine (ZANTAC) 150 MG tablet, TAKE ONE TABLET BY MOUTH ONCE DAILY, Disp: 90 tablet, Rfl: 1 .  vitamin B-12 (CYANOCOBALAMIN) 1000 MCG tablet, Take 1,000 mcg by mouth daily., Disp: , Rfl:  .  Vitamin D, Ergocalciferol, (DRISDOL) 50000 units CAPS capsule, Take 1 capsule (50,000 Units total) by mouth every 7 (seven) days., Disp: 12 capsule, Rfl: 0:  :  No Known Allergies:  Family History  Problem Relation Age of Onset  . Hypertension Mother   . Alcohol abuse Mother   . Cancer Mother   . Hyperlipidemia Mother   . Early death Mother   . Diabetes Mother   . Early death Son   . Early death Maternal Aunt   . Early death Maternal Uncle   . Early death Paternal 83   . Heart disease Maternal Grandmother   . Stroke Maternal Grandmother   :  Social History   Social History  . Marital Status: Single    Spouse Name: N/A  . Number of Children: 1  . Years of Education: N/A   Occupational History  .     Social History Main Topics  . Smoking status: Former Smoker -- 1.00 packs/day for 30 years    Quit date: 01/31/2012  . Smokeless tobacco: Never  Used  . Alcohol Use: 0.0 oz/week    0 Standard drinks or equivalent per week     Comment: 36-48 oz of malt beverages per day  . Drug Use: No  . Sexual Activity: Not on file   Other Topics Concern  . Not on file   Social History Narrative  :  Pertinent items are noted in HPI.  Exam: @IPVITALS @  well-developed and well-nourished white female in no obvious distress. Her vital signs show a temperature more of 98.2. Pulse 66. Blood pressure 114/63. Weight is 172 pounds. Head and neck exam shows no ocular or oral lesions. She has facial plethora. She has no conjunctival inflammation. She has no adenopathy in neck. Lungs are clear bilaterally. Cardiac exam regular rate and rhythm with no murmurs rubs or bruits. Abdomen is soft. Shows good bowel  sounds. There is no fluid wave. There is no palpable liver or spleen tip. Back exam shows no tenderness over the spine, ribs or hips. Extremities no clubbing cyanosis or edema. Neurological exam shows no focal deficits. Skin exam shows a ruddy complexion.   Recent Labs  04/24/15 1407  WBC 7.3  HGB 17.7*  HCT 49.7*  PLT 152   No results for input(s): NA, K, CL, CO2, GLUCOSE, BUN, CREATININE, CALCIUM in the last 72 hours.  Blood smear review: Normochromic and normocytic population of red blood cells. There are no target cells. There is no rouleau formation. There are no nucleated red blood cells. She has no schistocytes or spherocytes. White cells per normal in morphology maturation. There is no immature myeloid or lymphoid forms. There are no hypersegmented polys. Platelets are adequate in number and size. There are no large platelets.  Pathology: None     Assessment and Plan:  Rachel Scott is a 50 year old white female. She has erythrocytosis. By the new criteria, she does have significant erythrocytosis.  I think by the fat that she had a low erythropoietin level III years ago, she probably qualifies as having polycythemia. I am sending off a JAK2 mutation on her period. She is smoking. Again the smoking may be having an issue with her erythrocytosis.  I think we have to phlebotomize her. I really think that she is at risk for thromboembolic events.  She is taking a full aspirin. I told her to take a baby aspirin daily.  She is planning to go to [place] in May. I want to make sure that we aren't aggressive and get her blood count down with a hematocrit below 45% before she goes on the long trip over to [place].  I spent about 45 minutes with her. It was nice to see her again.  We will plan to phlebotomize her in a week. I want to see her back in about 3 weeks afterwards and we will see how her blood count looks.  I made sure to tell her not to take any iron supplements were vitamin  C.

## 2015-04-25 LAB — RETICULOCYTES: Reticulocyte Count: 1.9 % (ref 0.6–2.6)

## 2015-04-26 LAB — ERYTHROPOIETIN: Erythropoietin: 8.8 m[IU]/mL (ref 2.6–18.5)

## 2015-04-27 LAB — HEMOGLOBINOPATHY EVALUATION
HEMOGLOBIN A2 QUANTITATION: 2.5 % (ref 0.7–3.1)
HEMOGLOBIN F QUANTITATION: 0 % (ref 0.0–2.0)
HGB C: 0 %
HGB S: 0 %
Hgb A: 97.5 % (ref 94.0–98.0)

## 2015-04-27 LAB — IRON AND TIBC
%SAT: 30 % (ref 21–57)
IRON: 117 ug/dL (ref 41–142)
TIBC: 391 ug/dL (ref 236–444)
UIBC: 274 ug/dL (ref 120–384)

## 2015-04-27 LAB — FERRITIN: FERRITIN: 100 ng/mL (ref 9–269)

## 2015-05-01 ENCOUNTER — Ambulatory Visit (HOSPITAL_BASED_OUTPATIENT_CLINIC_OR_DEPARTMENT_OTHER): Payer: Managed Care, Other (non HMO)

## 2015-05-01 DIAGNOSIS — D751 Secondary polycythemia: Secondary | ICD-10-CM | POA: Diagnosis not present

## 2015-05-01 NOTE — Progress Notes (Signed)
Rachel Scott presents today for phlebotomy per MD orders. Phlebotomy procedure started at 0940 and ended at 1000. 500 grams removed from rt AC using 20g IV needle.  Patient observed for 30 minutes after procedure without any incident. Patient tolerated procedure well. IV needle removed intact.  Pt appeared anxious and diaphoretic on presentation. Reports hx of syncopy with blood draws. Discussed procedure prior to beginning and confirmed good po intake this am. Pt had no issues during phlebotomy. Discussed upcoming [place] trip to divert attention from procedure. dph

## 2015-05-01 NOTE — Patient Instructions (Signed)

## 2015-05-22 ENCOUNTER — Other Ambulatory Visit (HOSPITAL_BASED_OUTPATIENT_CLINIC_OR_DEPARTMENT_OTHER): Payer: Managed Care, Other (non HMO)

## 2015-05-22 ENCOUNTER — Encounter: Payer: Self-pay | Admitting: Hematology & Oncology

## 2015-05-22 ENCOUNTER — Ambulatory Visit (HOSPITAL_BASED_OUTPATIENT_CLINIC_OR_DEPARTMENT_OTHER): Payer: Managed Care, Other (non HMO) | Admitting: Hematology & Oncology

## 2015-05-22 VITALS — BP 119/74 | HR 61 | Temp 97.9°F | Resp 14 | Ht 60.0 in | Wt 164.0 lb

## 2015-05-22 DIAGNOSIS — D751 Secondary polycythemia: Secondary | ICD-10-CM | POA: Diagnosis not present

## 2015-05-22 DIAGNOSIS — R7989 Other specified abnormal findings of blood chemistry: Secondary | ICD-10-CM | POA: Diagnosis not present

## 2015-05-22 LAB — CBC WITH DIFFERENTIAL (CANCER CENTER ONLY)
BASO#: 0.1 10*3/uL (ref 0.0–0.2)
BASO%: 0.7 % (ref 0.0–2.0)
EOS ABS: 0.1 10*3/uL (ref 0.0–0.5)
EOS%: 1.2 % (ref 0.0–7.0)
HCT: 44.4 % (ref 34.8–46.6)
HEMOGLOBIN: 15.8 g/dL (ref 11.6–15.9)
LYMPH#: 3 10*3/uL (ref 0.9–3.3)
LYMPH%: 35.7 % (ref 14.0–48.0)
MCH: 35.8 pg — AB (ref 26.0–34.0)
MCHC: 35.6 g/dL (ref 32.0–36.0)
MCV: 101 fL (ref 81–101)
MONO#: 0.6 10*3/uL (ref 0.1–0.9)
MONO%: 6.8 % (ref 0.0–13.0)
NEUT%: 55.6 % (ref 39.6–80.0)
NEUTROS ABS: 4.6 10*3/uL (ref 1.5–6.5)
Platelets: 171 10*3/uL (ref 145–400)
RBC: 4.41 10*6/uL (ref 3.70–5.32)
RDW: 12.6 % (ref 11.1–15.7)
WBC: 8.3 10*3/uL (ref 3.9–10.0)

## 2015-05-22 NOTE — Progress Notes (Signed)
Hematology and Oncology Follow Up Visit  Rachel Scott [id number] [birth year] 50 y.o. 05/22/2015   Principle Diagnosis:   Polycythemia  Current Therapy:    Aspirin 81 mg by mouth daily  Phlebotomy to maintain hematocrit below 45%     Interim History:  Rachel Scott is back for follow-up. This is consistent. We first saw her back in early March, did some studies.. Her erythropoietin level was quite low at 8.8. She had normal iron studies. Her ferritin was 100 iron saturation was 30%.   We did phlebotomized. Is feeling better. She had a little bit more energy  She is going on vacation tomorrow. She is going to [facility].   The the next vacation will be made when she goes to [place] a 13.  . She's having no problems with work  She is still smoking. She is trying to cut back on this.  Has had no headaches. She has had no nausea or vomiting.  Overall, her performance status is ECOG 1  Medications:  Current outpatient prescriptions:  .  aspirin 81 MG tablet, Take 81 mg by mouth daily., Disp: , Rfl:  .  FLUoxetine (PROZAC) 40 MG capsule, TAKE ONE CAPSULE BY MOUTH ONCE DAILY, Disp: 90 capsule, Rfl: 1 .  lisinopril-hydrochlorothiazide (PRINZIDE,ZESTORETIC) 10-12.5 MG tablet, TAKE ONE TABLET BY MOUTH ONCE DAILY, Disp: 90 tablet, Rfl: 1 .  ranitidine (ZANTAC) 150 MG tablet, TAKE ONE TABLET BY MOUTH ONCE DAILY, Disp: 90 tablet, Rfl: 1 .  vitamin B-12 (CYANOCOBALAMIN) 1000 MCG tablet, Take 1,000 mcg by mouth daily., Disp: , Rfl:  .  Vitamin D, Ergocalciferol, (DRISDOL) 50000 units CAPS capsule, Take 1 capsule (50,000 Units total) by mouth every 7 (seven) days., Disp: 12 capsule, Rfl: 0  Allergies: No Known Allergies  Past Medical History, Surgical history, Social history, and Family History were reviewed and updated.  Review of Systems: As above  Physical Exam:  height is 5' (1.524 m) and weight is 164 lb (74.39 kg). Her oral temperature is 97.9 F (36.6 C). Her blood pressure  is 119/74 and her pulse is 61. Her respiration is 14.   Wt Readings from Last 3 Encounters:  05/22/15 164 lb (74.39 kg)  04/24/15 172 lb (78.019 kg)  03/30/15 174 lb 6.4 oz (79.107 kg)     Head and neck exam shows no ocular or oral lesions. She has decreased facial plethora. She has no conjunctival inflammation. She has no adenopathy in neck. Lungs are clear bilaterally. Cardiac exam regular rate and rhythm with no murmurs rubs or bruits. Abdomen is soft. Shows good bowel sounds. There is no fluid wave. There is no palpable liver or spleen tip. Back exam shows no tenderness over the spine, ribs or hips. Extremities no clubbing cyanosis or edema. Neurological exam shows no focal deficits. Skin exam shows a ruddy complexion.   Lab Results  Component Value Date   WBC 8.3 05/22/2015   HGB 15.8 05/22/2015   HCT 44.4 05/22/2015   MCV 101 05/22/2015   PLT 171 05/22/2015     Chemistry      Component Value Date/Time   NA 135 03/30/2015 0838   K 4.5 03/30/2015 0838   CL 98 03/30/2015 0838   CO2 28 03/30/2015 0838   BUN 9 03/30/2015 0838   CREATININE 0.84 03/30/2015 0838      Component Value Date/Time   CALCIUM 9.7 03/30/2015 0838   ALKPHOS 68 03/30/2015 0838   AST 46* 03/30/2015 0838   ALT 72* 03/30/2015  [id number]   BILITOT 0.6 03/30/2015 0838         Impression and Plan: Rachel Scott is A 50 year old white female. She has erythrocytosis. 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 Her erythropoietin level is quite low. That she does smoke so she may have polycythemia  I think that we can hold on phlebotomizing her now.  I am taking the JAK2 mutation. We obtained this with this visit.  I will plan to see her back in one month. I want to see her back before she goes to [place] and get her blood optimized.          Volanda Napoleon, MD 3/31/20172:02 PM

## 2015-05-25 LAB — IRON AND TIBC
%SAT: 13 % — AB (ref 21–57)
IRON: 58 ug/dL (ref 41–142)
TIBC: 434 ug/dL (ref 236–444)
UIBC: 376 ug/dL (ref 120–384)

## 2015-05-25 LAB — FERRITIN: FERRITIN: 41 ng/mL (ref 9–269)

## 2015-05-29 ENCOUNTER — Encounter: Payer: Self-pay | Admitting: Hematology & Oncology

## 2015-06-25 ENCOUNTER — Encounter: Payer: Self-pay | Admitting: Hematology & Oncology

## 2015-06-25 ENCOUNTER — Ambulatory Visit (HOSPITAL_BASED_OUTPATIENT_CLINIC_OR_DEPARTMENT_OTHER): Payer: Managed Care, Other (non HMO) | Admitting: Hematology & Oncology

## 2015-06-25 ENCOUNTER — Ambulatory Visit: Payer: Managed Care, Other (non HMO)

## 2015-06-25 ENCOUNTER — Other Ambulatory Visit (HOSPITAL_BASED_OUTPATIENT_CLINIC_OR_DEPARTMENT_OTHER): Payer: Managed Care, Other (non HMO)

## 2015-06-25 VITALS — BP 135/72 | HR 58 | Temp 98.0°F | Resp 16 | Ht 60.0 in | Wt 168.0 lb

## 2015-06-25 DIAGNOSIS — D751 Secondary polycythemia: Secondary | ICD-10-CM | POA: Diagnosis not present

## 2015-06-25 DIAGNOSIS — Z720 Tobacco use: Secondary | ICD-10-CM

## 2015-06-25 LAB — CBC WITH DIFFERENTIAL (CANCER CENTER ONLY)
BASO#: 0.1 10*3/uL (ref 0.0–0.2)
BASO%: 0.8 % (ref 0.0–2.0)
EOS%: 2 % (ref 0.0–7.0)
Eosinophils Absolute: 0.2 10*3/uL (ref 0.0–0.5)
HEMATOCRIT: 44.5 % (ref 34.8–46.6)
HGB: 15.6 g/dL (ref 11.6–15.9)
LYMPH#: 2.8 10*3/uL (ref 0.9–3.3)
LYMPH%: 34.1 % (ref 14.0–48.0)
MCH: 36.5 pg — ABNORMAL HIGH (ref 26.0–34.0)
MCHC: 35.1 g/dL (ref 32.0–36.0)
MCV: 104 fL — ABNORMAL HIGH (ref 81–101)
MONO#: 0.6 10*3/uL (ref 0.1–0.9)
MONO%: 7.7 % (ref 0.0–13.0)
NEUT#: 4.6 10*3/uL (ref 1.5–6.5)
NEUT%: 55.4 % (ref 39.6–80.0)
PLATELETS: 177 10*3/uL (ref 145–400)
RBC: 4.27 10*6/uL (ref 3.70–5.32)
RDW: 14.2 % (ref 11.1–15.7)
WBC: 8.3 10*3/uL (ref 3.9–10.0)

## 2015-06-25 NOTE — Progress Notes (Signed)
Hematology and Oncology Follow Up Visit  Rachel Scott [id number] [birth year] 50 y.o. 06/25/2015   Principle Diagnosis:   Polycythemia  Current Therapy:    Aspirin 81 mg by mouth daily  Phlebotomy to maintain hematocrit below 45%     Interim History:  Rachel Scott is back for follow-up. Rachel Scott will be headed to [place] next week. Rachel Scott is really looking for to this. Rachel Scott'll be going out for 1 week.  Overall, Rachel Scott feels well. Rachel Scott's had no headache. Rachel Scott's had no rashes. There is no tingling in hands or feet. Rachel Scott's had no change in bowel or bladder habits.  Rachel Scott was last phlebotomized back in March.  Her iron studies have looked very good. Back in late March, her ferritin was down to 41 with iron saturation of 13%..  Overall, her performance status is ECOG 1  Medications:  Current outpatient prescriptions:  .  aspirin 81 MG tablet, Take 81 mg by mouth daily., Disp: , Rfl:  .  FLUoxetine (PROZAC) 40 MG capsule, TAKE ONE CAPSULE BY MOUTH ONCE DAILY, Disp: 90 capsule, Rfl: 1 .  lisinopril-hydrochlorothiazide (PRINZIDE,ZESTORETIC) 10-12.5 MG tablet, TAKE ONE TABLET BY MOUTH ONCE DAILY, Disp: 90 tablet, Rfl: 1 .  ranitidine (ZANTAC) 150 MG tablet, TAKE ONE TABLET BY MOUTH ONCE DAILY, Disp: 90 tablet, Rfl: 1 .  vitamin B-12 (CYANOCOBALAMIN) 1000 MCG tablet, Take 1,000 mcg by mouth daily., Disp: , Rfl:   Allergies: No Known Allergies  Past Medical History, Surgical history, Social history, and Family History were reviewed and updated.  Review of Systems: As above  Physical Exam:  height is 5' (1.524 m) and weight is 168 lb (76.204 kg). Her temperature is 98 F (36.7 C). Her blood pressure is 135/72 and her pulse is 58. Her respiration is 16.   Wt Readings from Last 3 Encounters:  06/25/15 168 lb (76.204 kg)  05/22/15 164 lb (74.39 kg)  04/24/15 172 lb (78.019 kg)     Head and neck exam shows no ocular or oral lesions. Rachel Scott has decreased facial plethora. Rachel Scott has no conjunctival  inflammation. Rachel Scott has no adenopathy in neck. Lungs are clear bilaterally. Cardiac exam regular rate and rhythm with no murmurs rubs or bruits. Abdomen is soft. Shows good bowel sounds. There is no fluid wave. There is no palpable liver or spleen tip. Back exam shows no tenderness over the spine, ribs or hips. Extremities no clubbing cyanosis or edema. Neurological exam shows no focal deficits. Skin exam shows a ruddy complexion.   Lab Results  Component Value Date   WBC 8.3 06/25/2015   HGB 15.6 06/25/2015   HCT 44.5 06/25/2015   MCV 104* 06/25/2015   PLT 177 06/25/2015     Chemistry      Component Value Date/Time   NA 135 03/30/2015 0838   K 4.5 03/30/2015 0838   CL 98 03/30/2015 0838   CO2 28 03/30/2015 0838   BUN 9 03/30/2015 0838   CREATININE 0.84 03/30/2015 0838      Component Value Date/Time   CALCIUM 9.7 03/30/2015 0838   ALKPHOS 68 03/30/2015 0838   AST 46* 03/30/2015 0838   ALT 72* 03/30/2015 0838   BILITOT 0.6 03/30/2015 0838         Impression and Plan: Rachel Scott is A 50 year old white female. Rachel Scott has erythrocytosis. 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 Her erythropoietin level is quite low. 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  I think that we can hold on phlebotomizing  her now. I think that Rachel Scott should be okay going out to [place]. I told her to make sure Rachel Scott drinks a lot of water. I want to make sure that Rachel Scott does not get dehydrated on the flight. Rachel Scott is already taking aspirin. Rachel Scott will continue take the baby aspirin.  I am checking the JAK2 mutation. We obtained this with this visit.  I will plan to see her back in 6 weeks.. Probably will have to phlebotomize her only see her back.   Volanda Napoleon, MD 5/4/20174:33 PM

## 2015-06-25 NOTE — Progress Notes (Signed)
No phlebotomy today per dr. ennever 

## 2015-08-07 ENCOUNTER — Ambulatory Visit (HOSPITAL_BASED_OUTPATIENT_CLINIC_OR_DEPARTMENT_OTHER): Payer: Managed Care, Other (non HMO) | Admitting: Hematology & Oncology

## 2015-08-07 ENCOUNTER — Encounter: Payer: Self-pay | Admitting: Hematology & Oncology

## 2015-08-07 ENCOUNTER — Other Ambulatory Visit (HOSPITAL_BASED_OUTPATIENT_CLINIC_OR_DEPARTMENT_OTHER): Payer: Managed Care, Other (non HMO)

## 2015-08-07 ENCOUNTER — Ambulatory Visit (HOSPITAL_BASED_OUTPATIENT_CLINIC_OR_DEPARTMENT_OTHER): Payer: Managed Care, Other (non HMO)

## 2015-08-07 VITALS — BP 122/74 | HR 63

## 2015-08-07 VITALS — BP 135/67 | HR 62 | Temp 97.9°F | Resp 16 | Ht 60.0 in | Wt 165.0 lb

## 2015-08-07 DIAGNOSIS — D751 Secondary polycythemia: Secondary | ICD-10-CM

## 2015-08-07 DIAGNOSIS — D45 Polycythemia vera: Secondary | ICD-10-CM

## 2015-08-07 LAB — COMPREHENSIVE METABOLIC PANEL (CC13)
A/G RATIO: 1.7 (ref 1.2–2.2)
ALBUMIN: 4.6 g/dL (ref 3.5–5.5)
ALT: 93 IU/L — AB (ref 0–32)
AST (SGOT): 77 IU/L — ABNORMAL HIGH (ref 0–40)
Alkaline Phosphatase, S: 90 IU/L (ref 39–117)
BILIRUBIN TOTAL: 0.7 mg/dL (ref 0.0–1.2)
BUN / CREAT RATIO: 16 (ref 9–23)
BUN: 12 mg/dL (ref 6–24)
CALCIUM: 10.1 mg/dL (ref 8.7–10.2)
CHLORIDE: 98 mmol/L (ref 96–106)
Carbon Dioxide, Total: 26 mmol/L (ref 18–29)
Creatinine, Ser: 0.77 mg/dL (ref 0.57–1.00)
GFR, EST AFRICAN AMERICAN: 105 mL/min/{1.73_m2} (ref >59)
GFR, EST NON AFRICAN AMERICAN: 91 mL/min/{1.73_m2} (ref >59)
GLOBULIN, TOTAL: 2.7 g/dL (ref 1.5–4.5)
Glucose: 106 mg/dL — ABNORMAL HIGH (ref 65–99)
POTASSIUM: 4.9 mmol/L (ref 3.5–5.2)
SODIUM: 135 mmol/L (ref 134–144)
TOTAL PROTEIN: 7.3 g/dL (ref 6.0–8.5)

## 2015-08-07 LAB — CBC WITH DIFFERENTIAL (CANCER CENTER ONLY)
BASO#: 0.1 10*3/uL (ref 0.0–0.2)
BASO%: 0.9 % (ref 0.0–2.0)
EOS%: 1.4 % (ref 0.0–7.0)
Eosinophils Absolute: 0.1 10*3/uL (ref 0.0–0.5)
HEMATOCRIT: 47.4 % — AB (ref 34.8–46.6)
HGB: 17 g/dL — ABNORMAL HIGH (ref 11.6–15.9)
LYMPH#: 3 10*3/uL (ref 0.9–3.3)
LYMPH%: 30.7 % (ref 14.0–48.0)
MCH: 37.1 pg — AB (ref 26.0–34.0)
MCHC: 35.9 g/dL (ref 32.0–36.0)
MCV: 104 fL — AB (ref 81–101)
MONO#: 0.6 10*3/uL (ref 0.1–0.9)
MONO%: 6.3 % (ref 0.0–13.0)
NEUT#: 6 10*3/uL (ref 1.5–6.5)
NEUT%: 60.7 % (ref 39.6–80.0)
PLATELETS: 168 10*3/uL (ref 145–400)
RBC: 4.58 10*6/uL (ref 3.70–5.32)
RDW: 13.6 % (ref 11.1–15.7)
WBC: 9.9 10*3/uL (ref 3.9–10.0)

## 2015-08-07 NOTE — Patient Instructions (Signed)
Therapeutic Phlebotomy, Care After  Refer to this sheet in the next few weeks. These instructions provide you with information about caring for yourself after your procedure. Your health care provider may also give you more specific instructions. Your treatment has been planned according to current medical practices, but problems sometimes occur. Call your health care provider if you have any problems or questions after your procedure.  WHAT TO EXPECT AFTER THE PROCEDURE  After your procedure, it is common to have:   Light-headedness or dizziness. You may feel faint.   Nausea.   Tiredness.  HOME CARE INSTRUCTIONS  Activities   Return to your normal activities as directed by your health care provider. Most people can go back to their normal activities right away.   Avoid strenuous physical activity and heavy lifting or pulling for about 5 hours after the procedure. Do not lift anything that is heavier than 10 lb (4.5 kg).   Athletes should avoid strenuous exercise for at least 12 hours.   Change positions slowly for the remainder of the day. This will help to prevent light-headedness or fainting.   If you feel light-headed, lie down until the feeling goes away.  Eating and Drinking   Be sure to eat well-balanced meals for the next 24 hours.   Drink enough fluid to keep your urine clear or pale yellow.   Avoid drinking alcohol on the day that you had the procedure.  Care of the Needle Insertion Site   Keep your bandage dry. You can remove the bandage after about 5 hours or as directed by your health care provider.   If you have bleeding from the needle insertion site, elevate your arm and press firmly on the site until the bleeding stops.   If you have bruising at the site, apply ice to the area:   Put ice in a plastic bag.   Place a towel between your skin and the bag.   Leave the ice on for 20 minutes, 2-3 times a day for the first 24 hours.   If the swelling does not go away after 24 hours, apply  a warm, moist washcloth to the area for 20 minutes, 2-3 times a day.  General Instructions   Avoid smoking for at least 30 minutes after the procedure.   Keep all follow-up visits as directed by your health care provider. It is important to continue with further therapeutic phlebotomy treatments as directed.  SEEK MEDICAL CARE IF:   You have redness, swelling, or pain at the needle insertion site.   You have fluid, blood, or pus coming from the needle insertion site.   You feel light-headed, dizzy, or nauseated, and the feeling does not go away.   You notice new bruising at the needle insertion site.   You feel weaker than normal.   You have a fever or chills.  SEEK IMMEDIATE MEDICAL CARE IF:   You have severe nausea or vomiting.   You have chest pain.   You have trouble breathing.    This information is not intended to replace advice given to you by your health care provider. Make sure you discuss any questions you have with your health care provider.    Document Released: 07/12/2010 Document Revised: 06/24/2014 Document Reviewed: 02/03/2014  Elsevier Interactive Patient Education 2016 [employer] Inc[employer]

## 2015-08-07 NOTE — Progress Notes (Signed)
Hematology and Oncology Follow Up Visit  Rachel Scott [id number] May 03, 1965 50 y.o. 08/07/2015   Principle Diagnosis:   Polycythemia - secondary (JAK2 -)  Current Therapy:    Aspirin 81 mg by mouth daily  Phlebotomy to maintain hematocrit below 45%     Interim History:  Rachel Scott is back for follow-up. She had a fantastic time in [place]. She was other for a week. She really enjoyed herself. [place] She had a fantastic experience. She was incredibly busy while she was out there. She went snorkeling. She jumped off a 50 foot cliff. She and I helicopter ride. She a great food. She had no problems on the flight to and from [place]. She is very, her incontinent probably back a bag of [place] coffee.  She is still working quite a bit. She is still smoking.  She is off to [facility] for clinic conference soon.  She has had no headache. She has had no bleeding. She's had no leg swelling.  Overall, her performance status is ECOG 1  Medications:  Current outpatient prescriptions:  .  aspirin 81 MG tablet, Take 81 mg by mouth daily., Disp: , Rfl:  .  FLUoxetine (PROZAC) 40 MG capsule, TAKE ONE CAPSULE BY MOUTH ONCE DAILY, Disp: 90 capsule, Rfl: 1 .  lisinopril-hydrochlorothiazide (PRINZIDE,ZESTORETIC) 10-12.5 MG tablet, TAKE ONE TABLET BY MOUTH ONCE DAILY, Disp: 90 tablet, Rfl: 1 .  ranitidine (ZANTAC) 150 MG tablet, TAKE ONE TABLET BY MOUTH ONCE DAILY, Disp: 90 tablet, Rfl: 1 .  vitamin B-12 (CYANOCOBALAMIN) 1000 MCG tablet, Take 1,000 mcg by mouth daily., Disp: , Rfl:   Allergies: No Known Allergies  Past Medical History, Surgical history, Social history, and Family History were reviewed and updated.  Review of Systems: As above  Physical Exam:  height is 5' (1.524 m) and weight is 165 lb (74.844 kg). Her oral temperature is 97.9 F (36.6 C). Her blood pressure is 135/67 and her pulse is 62. Her respiration is 16.   Wt Readings from Last 3  Encounters:  08/07/15 165 lb (74.844 kg)  06/25/15 168 lb (76.204 kg)  05/22/15 164 lb (74.39 kg)     Head and neck exam shows no ocular or oral lesions. She has decreased facial plethora. She has no conjunctival inflammation. She has no adenopathy in neck. Lungs are clear bilaterally. Cardiac exam regular rate and rhythm with no murmurs rubs or bruits. Abdomen is soft. Shows good bowel sounds. There is no fluid wave. There is no palpable liver or spleen tip. Back exam shows no tenderness over the spine, ribs or hips. Extremities no clubbing cyanosis or edema. Neurological exam shows no focal deficits. Skin exam shows a ruddy complexion.   Lab Results  Component Value Date   WBC 9.9 08/07/2015   HGB 17.0* 08/07/2015   HCT 47.4* 08/07/2015   MCV 104* 08/07/2015   PLT 168 08/07/2015     Chemistry      Component Value Date/Time   NA 135 08/07/2015 1530   NA 135 03/30/2015 0838   K 4.9 08/07/2015 1530   K 4.5 03/30/2015 0838   CL 98 08/07/2015 1530   CL 98 03/30/2015 0838   CO2 26 08/07/2015 1530   CO2 28 03/30/2015 0838   BUN 12 08/07/2015 1530   BUN 9 03/30/2015 0838   CREATININE 0.77 08/07/2015 1530   CREATININE 0.84 03/30/2015 0838      Component Value Date/Time   CALCIUM 10.1 08/07/2015  1530   CALCIUM 9.7 03/30/2015 0838   ALKPHOS 90 08/07/2015 1530   ALKPHOS 68 03/30/2015 0838   AST 77* 08/07/2015 1530   AST 46* 03/30/2015 0838   ALT 93* 08/07/2015 1530   ALT 72* 03/30/2015 0838   BILITOT 0.7 08/07/2015 1530   BILITOT 0.6 03/30/2015 0838         Impression and Plan: Rachel Scott is A 50 year old white female. She has erythrocytosis. I think she could have polycythemia vera. Her erythropoietin level is quite low. She does smoke so she may have secondary polycythemia.  Her JAK2 assays negative.  We'll go ahead and phlebotomize her. I think this should be worthwhile. I think this will help.  For now, I think we probably get her back in 3 months. I think this in  the reasonable.  Volanda Napoleon, MD 6/16/20176:09 PM

## 2015-08-07 NOTE — Progress Notes (Signed)
Rachel Scott presents today for phlebotomy per MD orders. Phlebotomy procedure started at 1625 and ended at 1640. 500 ml  removed. Patient observed for 30 minutes after procedure without any incident. Patient tolerated procedure well. IV needle removed intact.

## 2015-08-10 LAB — FERRITIN: FERRITIN: 44 ng/mL (ref 9–269)

## 2015-08-10 LAB — IRON AND TIBC
%SAT: 26 % (ref 21–57)
IRON: 114 ug/dL (ref 41–142)
TIBC: 433 ug/dL (ref 236–444)
UIBC: 318 ug/dL (ref 120–384)

## 2015-08-29 ENCOUNTER — Other Ambulatory Visit: Payer: Self-pay | Admitting: Family Medicine

## 2015-08-31 NOTE — Telephone Encounter (Signed)
Medication filled to pharmacy as requested.   

## 2015-09-28 ENCOUNTER — Encounter: Payer: Self-pay | Admitting: Family Medicine

## 2015-09-28 ENCOUNTER — Ambulatory Visit (INDEPENDENT_AMBULATORY_CARE_PROVIDER_SITE_OTHER): Payer: Managed Care, Other (non HMO) | Admitting: Family Medicine

## 2015-09-28 VITALS — BP 121/68 | HR 97 | Temp 97.9°F | Resp 16 | Ht 60.0 in | Wt 164.0 lb

## 2015-09-28 DIAGNOSIS — E785 Hyperlipidemia, unspecified: Secondary | ICD-10-CM | POA: Diagnosis not present

## 2015-09-28 DIAGNOSIS — I10 Essential (primary) hypertension: Secondary | ICD-10-CM

## 2015-09-28 LAB — LIPID PANEL
CHOLESTEROL: 189 mg/dL (ref 0–200)
HDL: 37.4 mg/dL — ABNORMAL LOW (ref >39.00)
LDL Cholesterol: 128 mg/dL — ABNORMAL HIGH (ref 0–99)
NONHDL: 151.52
Total CHOL/HDL Ratio: 5
Triglycerides: 117 mg/dL (ref 0.0–149.0)
VLDL: 23.4 mg/dL (ref 0.0–40.0)

## 2015-09-28 LAB — TSH: TSH: 1.51 u[IU]/mL (ref 0.35–4.50)

## 2015-09-28 MED ORDER — LISINOPRIL-HYDROCHLOROTHIAZIDE 10-12.5 MG PO TABS: 1.0000 | ORAL_TABLET | Freq: Every day | ORAL | 1 refills | Status: DC

## 2015-09-28 MED ORDER — RANITIDINE HCL 150 MG PO TABS: 150.0000 mg | ORAL_TABLET | Freq: Every day | ORAL | 1 refills | Status: DC

## 2015-09-28 MED ORDER — FLUOXETINE HCL 40 MG PO CAPS: 40.0000 mg | ORAL_CAPSULE | Freq: Every day | ORAL | 1 refills | Status: DC

## 2015-09-28 NOTE — Progress Notes (Signed)
Pre visit review using our clinic review tool, if applicable. No additional management support is needed unless otherwise documented below in the visit note. 

## 2015-09-28 NOTE — Progress Notes (Signed)
   Subjective:    Patient ID: Rachel Scott, female    DOB: May 12, 1965, 50 y.o.   MRN: [id number]  HPI HTN- chronic problem, on Lisinopril HCTZ daily.  No CP, SOB, HAs, visual changes, edema.  Limited exercise due to recent heat.  Walking more at work- taking stairs.  Hyperlipidemia- chronic problem, controlling w/ diet and exercise.  Has never been on meds.  Review of Systems For ROS see HPI     Objective:   Physical Exam  Constitutional: She is oriented to person, place, and time. She appears well-developed and well-nourished. No distress.  HENT:  Head: Normocephalic and atraumatic.  Eyes: Conjunctivae and EOM are normal. Pupils are equal, round, and reactive to light.  Neck: Normal range of motion. Neck supple. No thyromegaly present.  Cardiovascular: Normal rate, regular rhythm, normal heart sounds and intact distal pulses.   No murmur heard. Pulmonary/Chest: Effort normal and breath sounds normal. No respiratory distress.  Abdominal: Soft. She exhibits no distension. There is no tenderness.  Musculoskeletal: She exhibits no edema.  Lymphadenopathy:    She has no cervical adenopathy.  Neurological: She is alert and oriented to person, place, and time.  Skin: Skin is warm and dry.  Psychiatric: She has a normal mood and affect. Her behavior is normal.  Vitals reviewed.         Assessment & Plan:

## 2015-09-28 NOTE — Assessment & Plan Note (Signed)
Chronic problem.  Well controlled on Lisinopril HCTZ daily.  Asymptomatic.  Reviewed recent CMP done at Hematology- no changes required.

## 2015-09-28 NOTE — Patient Instructions (Signed)
Schedule your complete physical in 6 months We'll notify you of your lab results and make any changes if needed Keep up the good work on healthy diet and regular exercise- you can do it! Call with any questions or concerns Enjoy the rest of your summer!!! 

## 2015-09-28 NOTE — Assessment & Plan Note (Signed)
Chronic problem.  Has not been on a statin.  Attempting to control w/ healthy diet and regular exercise.  Check labs.  Start meds prn.

## 2015-11-06 ENCOUNTER — Ambulatory Visit: Payer: Managed Care, Other (non HMO) | Admitting: Hematology & Oncology

## 2015-11-06 ENCOUNTER — Other Ambulatory Visit: Payer: Managed Care, Other (non HMO)

## 2015-11-13 ENCOUNTER — Ambulatory Visit (HOSPITAL_BASED_OUTPATIENT_CLINIC_OR_DEPARTMENT_OTHER): Payer: Managed Care, Other (non HMO) | Admitting: Hematology & Oncology

## 2015-11-13 ENCOUNTER — Other Ambulatory Visit (HOSPITAL_BASED_OUTPATIENT_CLINIC_OR_DEPARTMENT_OTHER): Payer: Managed Care, Other (non HMO)

## 2015-11-13 ENCOUNTER — Encounter: Payer: Self-pay | Admitting: Hematology & Oncology

## 2015-11-13 VITALS — BP 121/60 | HR 66 | Temp 98.0°F | Resp 16 | Ht 60.0 in | Wt 167.8 lb

## 2015-11-13 DIAGNOSIS — D751 Secondary polycythemia: Secondary | ICD-10-CM

## 2015-11-13 LAB — CBC WITH DIFFERENTIAL (CANCER CENTER ONLY)
BASO#: 0.1 10*3/uL (ref 0.0–0.2)
BASO%: 0.9 % (ref 0.0–2.0)
EOS%: 1.8 % (ref 0.0–7.0)
Eosinophils Absolute: 0.1 10*3/uL (ref 0.0–0.5)
HCT: 42.8 % (ref 34.8–46.6)
HGB: 15.2 g/dL (ref 11.6–15.9)
LYMPH#: 2.5 10*3/uL (ref 0.9–3.3)
LYMPH%: 32.9 % (ref 14.0–48.0)
MCH: 35.6 pg — AB (ref 26.0–34.0)
MCHC: 35.5 g/dL (ref 32.0–36.0)
MCV: 100 fL (ref 81–101)
MONO#: 0.5 10*3/uL (ref 0.1–0.9)
MONO%: 6.7 % (ref 0.0–13.0)
NEUT#: 4.4 10*3/uL (ref 1.5–6.5)
NEUT%: 57.7 % (ref 39.6–80.0)
PLATELETS: 168 10*3/uL (ref 145–400)
RBC: 4.27 10*6/uL (ref 3.70–5.32)
RDW: 12.7 % (ref 11.1–15.7)
WBC: 7.7 10*3/uL (ref 3.9–10.0)

## 2015-11-13 LAB — IRON AND TIBC
%SAT: 10 % — ABNORMAL LOW (ref 21–57)
Iron: 44 ug/dL (ref 41–142)
TIBC: 443 ug/dL (ref 236–444)
UIBC: 398 ug/dL — AB (ref 120–384)

## 2015-11-13 LAB — FERRITIN: FERRITIN: 37 ng/mL (ref 9–269)

## 2015-11-13 NOTE — Progress Notes (Signed)
Hematology and Oncology Follow Up Visit  Rachel Scott [id number] 12-Mar-1965 50 y.o. 11/13/2015   Principle Diagnosis:   Polycythemia - secondary (JAK2 -)  Current Therapy:    Aspirin 81 mg by mouth daily  Phlebotomy to maintain hematocrit below 45%     Interim History:  Rachel Scott is back for follow-up. She looks real good. She feels good. Thankfully, she was not released from her job. She made the cutbacks.  She is trying to cut back on her smoking. I did this will help her around.  There's been no problems with fatigue or weakness. She was up in [place] for the [employer] football game last week. She really had a great time.  She's had no nausea or vomiting. She's had no cough. She's had no headache.  We last saw her, her ferritin was 44. Her iron saturation was 26%.  Overall, her performance status is ECOG 1  Medications:  Current Outpatient Prescriptions:  .  aspirin 81 MG tablet, Take 81 mg by mouth daily., Disp: , Rfl:  .  Cholecalciferol (VITAMIN D3) 2000 units capsule, Take 2,000 Units by mouth daily., Disp: , Rfl:  .  FLUoxetine (PROZAC) 40 MG capsule, Take 1 capsule (40 mg total) by mouth daily., Disp: 90 capsule, Rfl: 1 .  lisinopril-hydrochlorothiazide (PRINZIDE,ZESTORETIC) 10-12.5 MG tablet, Take 1 tablet by mouth daily., Disp: 90 tablet, Rfl: 1 .  ranitidine (ZANTAC) 150 MG tablet, Take 1 tablet (150 mg total) by mouth daily., Disp: 90 tablet, Rfl: 1 .  vitamin B-12 (CYANOCOBALAMIN) 1000 MCG tablet, Take 1,000 mcg by mouth daily., Disp: , Rfl:   Allergies: No Known Allergies  Past Medical History, Surgical history, Social history, and Family History were reviewed and updated.  Review of Systems: As above  Physical Exam:  height is 5' (1.524 m) and weight is 167 lb 12.8 oz (76.1 kg). Her oral temperature is 98 F (36.7 C). Her blood pressure is 121/60 and her pulse is 66. Her respiration is 16.   Wt Readings from Last 3 Encounters:    11/13/15 167 lb 12.8 oz (76.1 kg)  09/28/15 164 lb (74.4 kg)  08/07/15 165 lb (74.8 kg)     Head and neck exam shows no ocular or oral lesions. She has decreased facial plethora. She has no conjunctival inflammation. She has no adenopathy in neck. Lungs are clear bilaterally. Cardiac exam regular rate and rhythm with no murmurs rubs or bruits. Abdomen is soft. Shows good bowel sounds. There is no fluid wave. There is no palpable liver or spleen tip. Back exam shows no tenderness over the spine, ribs or hips. Extremities no clubbing cyanosis or edema. Neurological exam shows no focal deficits. Skin exam shows a ruddy complexion.   Lab Results  Component Value Date   WBC 7.7 11/13/2015   HGB 15.2 11/13/2015   HCT 42.8 11/13/2015   MCV 100 11/13/2015   PLT 168 11/13/2015     Chemistry      Component Value Date/Time   NA 135 08/07/2015 1530   K 4.9 08/07/2015 1530   CL 98 08/07/2015 1530   CO2 26 08/07/2015 1530   BUN 12 08/07/2015 1530   CREATININE 0.77 08/07/2015 1530      Component Value Date/Time   CALCIUM 10.1 08/07/2015 1530   ALKPHOS 90 08/07/2015 1530   AST 77 (H) 08/07/2015 1530   ALT 93 (H) 08/07/2015 1530   BILITOT 0.7 08/07/2015 1530         Impression  and Plan: Rachel Scott is A 50 year old white female. She has erythrocytosis. I think she could have polycythemia vera. Her erythropoietin level is quite low. She does smoke so she may have secondary polycythemia.  Her JAK2 assays negative.  I think that is obvious that cutting back on smoking is helping her out. She does not need to be phlebotomized.    I think we probably get her back in 3 months. I think this in the reasonable.  Volanda Napoleon, MD 9/22/20172:26 PM

## 2015-11-13 NOTE — Progress Notes (Signed)
No phlebotomy today per Dr. Marin Olp orders. HCT 42.

## 2016-02-12 ENCOUNTER — Other Ambulatory Visit (HOSPITAL_BASED_OUTPATIENT_CLINIC_OR_DEPARTMENT_OTHER): Payer: Managed Care, Other (non HMO)

## 2016-02-12 ENCOUNTER — Ambulatory Visit (HOSPITAL_BASED_OUTPATIENT_CLINIC_OR_DEPARTMENT_OTHER): Payer: Managed Care, Other (non HMO) | Admitting: Hematology & Oncology

## 2016-02-12 VITALS — BP 114/67 | HR 63 | Temp 98.4°F | Resp 16 | Wt 168.0 lb

## 2016-02-12 DIAGNOSIS — Z720 Tobacco use: Secondary | ICD-10-CM

## 2016-02-12 DIAGNOSIS — R7989 Other specified abnormal findings of blood chemistry: Secondary | ICD-10-CM | POA: Diagnosis not present

## 2016-02-12 DIAGNOSIS — D751 Secondary polycythemia: Secondary | ICD-10-CM | POA: Diagnosis not present

## 2016-02-12 DIAGNOSIS — R945 Abnormal results of liver function studies: Secondary | ICD-10-CM

## 2016-02-12 LAB — CMP (CANCER CENTER ONLY)
ALBUMIN: 4.2 g/dL (ref 3.3–5.5)
ALK PHOS: 79 U/L (ref 26–84)
ALT: 83 U/L — AB (ref 10–47)
AST: 88 U/L — ABNORMAL HIGH (ref 11–38)
BUN, Bld: 7 mg/dL (ref 7–22)
CALCIUM: 9.7 mg/dL (ref 8.0–10.3)
CO2: 28 mEq/L (ref 18–33)
Chloride: 100 mEq/L (ref 98–108)
Creat: 0.8 mg/dl (ref 0.6–1.2)
Glucose, Bld: 107 mg/dL (ref 73–118)
POTASSIUM: 4.2 meq/L (ref 3.3–4.7)
Sodium: 140 mEq/L (ref 128–145)
TOTAL PROTEIN: 7.1 g/dL (ref 6.4–8.1)
Total Bilirubin: 0.7 mg/dl (ref 0.20–1.60)

## 2016-02-12 LAB — CBC WITH DIFFERENTIAL (CANCER CENTER ONLY)
BASO#: 0.1 10*3/uL (ref 0.0–0.2)
BASO%: 0.8 % (ref 0.0–2.0)
EOS ABS: 0.1 10*3/uL (ref 0.0–0.5)
EOS%: 1.3 % (ref 0.0–7.0)
HEMATOCRIT: 44.5 % (ref 34.8–46.6)
HEMOGLOBIN: 16 g/dL — AB (ref 11.6–15.9)
LYMPH#: 2.9 10*3/uL (ref 0.9–3.3)
LYMPH%: 31.1 % (ref 14.0–48.0)
MCH: 35.2 pg — AB (ref 26.0–34.0)
MCHC: 36 g/dL (ref 32.0–36.0)
MCV: 98 fL (ref 81–101)
MONO#: 0.6 10*3/uL (ref 0.1–0.9)
MONO%: 6 % (ref 0.0–13.0)
NEUT%: 60.8 % (ref 39.6–80.0)
NEUTROS ABS: 5.6 10*3/uL (ref 1.5–6.5)
Platelets: 191 10*3/uL (ref 145–400)
RBC: 4.54 10*6/uL (ref 3.70–5.32)
RDW: 13.4 % (ref 11.1–15.7)
WBC: 9.2 10*3/uL (ref 3.9–10.0)

## 2016-02-12 LAB — CHCC SATELLITE - SMEAR

## 2016-02-12 LAB — LACTATE DEHYDROGENASE: LDH: 224 U/L (ref 125–245)

## 2016-02-12 NOTE — Progress Notes (Signed)
Hematology and Oncology Follow Up Visit  Rachel Scott [id number] 21-Sep-1965 50 y.o. 02/12/2016   Principle Diagnosis:   Polycythemia - secondary (JAK2 -)  Current Therapy:    Aspirin 81 mg by mouth daily  Phlebotomy to maintain hematocrit below 45%     Interim History:  Rachel Scott is back for follow-up. She looks real good. She feels good. She had a very nice Thanksgiving.  She has to work on Christmas. She has to work on [employer].  She is still smoking. She's trying to cut back on smoking.  She's not been phlebotomize for about 6 months. I think she's done real well with this.  The big news is that she will be going back to [place] in May. She really loved going to [place] this past year. She wants to go back.   Back in September, her ferritin was down to 37. Her iron saturation is 10% so we haven't done a good job in making her iron deficient.   Overall, her performance status is ECOG 1  Medications:  Current Outpatient Prescriptions:  .  aspirin 81 MG tablet, Take 81 mg by mouth daily., Disp: , Rfl:  .  Cholecalciferol (VITAMIN D3) 2000 units capsule, Take 2,000 Units by mouth daily., Disp: , Rfl:  .  FLUoxetine (PROZAC) 40 MG capsule, Take 1 capsule (40 mg total) by mouth daily., Disp: 90 capsule, Rfl: 1 .  lisinopril-hydrochlorothiazide (PRINZIDE,ZESTORETIC) 10-12.5 MG tablet, Take 1 tablet by mouth daily., Disp: 90 tablet, Rfl: 1 .  ranitidine (ZANTAC) 150 MG tablet, Take 1 tablet (150 mg total) by mouth daily., Disp: 90 tablet, Rfl: 1 .  vitamin B-12 (CYANOCOBALAMIN) 1000 MCG tablet, Take 1,000 mcg by mouth daily., Disp: , Rfl:   Allergies: No Known Allergies  Past Medical History, Surgical history, Social history, and Family History were reviewed and updated.  Review of Systems: As above  Physical Exam:  weight is 168 lb (76.2 kg). Her oral temperature is 98.4 F (36.9 C). Her blood pressure is 114/67 and her pulse is 63. Her respiration is 16 and oxygen  saturation is 100%.   Wt Readings from Last 3 Encounters:  02/12/16 168 lb (76.2 kg)  11/13/15 167 lb 12.8 oz (76.1 kg)  09/28/15 164 lb (74.4 kg)     Head and neck exam shows no ocular or oral lesions. She has decreased facial plethora. She has no conjunctival inflammation. She has no adenopathy in neck. Lungs are clear bilaterally. Cardiac exam regular rate and rhythm with no murmurs rubs or bruits. Abdomen is soft. Shows good bowel sounds. There is no fluid wave. There is no palpable liver or spleen tip. Back exam shows no tenderness over the spine, ribs or hips. Extremities no clubbing cyanosis or edema. Neurological exam shows no focal deficits. Skin exam shows a ruddy complexion.   Lab Results  Component Value Date   WBC 9.2 02/12/2016   HGB 16.0 (H) 02/12/2016   HCT 44.5 02/12/2016   MCV 98 02/12/2016   PLT 191 02/12/2016     Chemistry      Component Value Date/Time   NA 140 02/12/2016 1336   K 4.2 02/12/2016 1336   CL 100 02/12/2016 1336   CO2 28 02/12/2016 1336   BUN 7 02/12/2016 1336   CREATININE 0.8 02/12/2016 1336      Component Value Date/Time   CALCIUM 9.7 02/12/2016 1336   ALKPHOS 79 02/12/2016 1336   AST 88 (H) 02/12/2016 1336   ALT 83 (  H) 02/12/2016 1336   BILITOT 0.70 02/12/2016 1336         Impression and Plan: Rachel Scott is a 50 year old white female. She has erythrocytosis. 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 Her erythropoietin level is quite low. She does smoke so she may have secondary polycythemia.  Her JAK2 assays negative.  I will hold off on phlebotomizing her today. She is very close to 45% but she feels well. She thinks that she can continue to  hold out on phlebotomy.  I will see her back in 3 months. I think she probably will need to be phlebotomize then.   Volanda Napoleon, MD 12/22/20172:43 PM

## 2016-02-16 LAB — IRON AND TIBC
%SAT: 17 % — AB (ref 21–57)
IRON: 73 ug/dL (ref 41–142)
TIBC: 438 ug/dL (ref 236–444)
UIBC: 365 ug/dL (ref 120–384)

## 2016-02-16 LAB — FERRITIN: FERRITIN: 57 ng/mL (ref 9–269)

## 2016-03-31 ENCOUNTER — Ambulatory Visit (INDEPENDENT_AMBULATORY_CARE_PROVIDER_SITE_OTHER): Payer: Managed Care, Other (non HMO) | Admitting: Family Medicine

## 2016-03-31 ENCOUNTER — Encounter: Payer: Self-pay | Admitting: Family Medicine

## 2016-03-31 VITALS — BP 115/73 | HR 75 | Temp 98.0°F | Resp 16 | Ht 60.0 in | Wt 168.4 lb

## 2016-03-31 DIAGNOSIS — Z1231 Encounter for screening mammogram for malignant neoplasm of breast: Secondary | ICD-10-CM

## 2016-03-31 DIAGNOSIS — Z0000 Encounter for general adult medical examination without abnormal findings: Secondary | ICD-10-CM | POA: Diagnosis not present

## 2016-03-31 LAB — HEPATIC FUNCTION PANEL
ALT: 82 U/L — AB (ref 0–35)
AST: 83 U/L — ABNORMAL HIGH (ref 0–37)
Albumin: 4.4 g/dL (ref 3.5–5.2)
Alkaline Phosphatase: 66 U/L (ref 39–117)
BILIRUBIN TOTAL: 0.6 mg/dL (ref 0.2–1.2)
Bilirubin, Direct: 0.1 mg/dL (ref 0.0–0.3)
Total Protein: 7.1 g/dL (ref 6.0–8.3)

## 2016-03-31 LAB — BASIC METABOLIC PANEL
BUN: 9 mg/dL (ref 6–23)
CALCIUM: 9.7 mg/dL (ref 8.4–10.5)
CO2: 32 meq/L (ref 19–32)
CREATININE: 0.73 mg/dL (ref 0.40–1.20)
Chloride: 101 mEq/L (ref 96–112)
GFR: 89.6 mL/min (ref >60.00)
Glucose, Bld: 104 mg/dL — ABNORMAL HIGH (ref 70–99)
Potassium: 5 mEq/L (ref 3.5–5.1)
Sodium: 138 mEq/L (ref 135–145)

## 2016-03-31 LAB — LIPID PANEL
CHOL/HDL RATIO: 4
Cholesterol: 183 mg/dL (ref 0–200)
HDL: 42.4 mg/dL (ref >39.00)
LDL Cholesterol: 123 mg/dL — ABNORMAL HIGH (ref 0–99)
NONHDL: 140.75
Triglycerides: 90 mg/dL (ref 0.0–149.0)
VLDL: 18 mg/dL (ref 0.0–40.0)

## 2016-03-31 LAB — CBC WITH DIFFERENTIAL/PLATELET
BASOS ABS: 0 10*3/uL (ref 0.0–0.1)
Basophils Relative: 0.5 % (ref 0.0–3.0)
Eosinophils Absolute: 0.1 10*3/uL (ref 0.0–0.7)
Eosinophils Relative: 1.9 % (ref 0.0–5.0)
HEMATOCRIT: 46.8 % — AB (ref 36.0–46.0)
Hemoglobin: 15.8 g/dL — ABNORMAL HIGH (ref 12.0–15.0)
LYMPHS PCT: 32.7 % (ref 12.0–46.0)
Lymphs Abs: 2.1 10*3/uL (ref 0.7–4.0)
MCHC: 33.8 g/dL (ref 30.0–36.0)
MCV: 106.1 fl — AB (ref 78.0–100.0)
MONOS PCT: 5.7 % (ref 3.0–12.0)
Monocytes Absolute: 0.4 10*3/uL (ref 0.1–1.0)
Neutro Abs: 3.8 10*3/uL (ref 1.4–7.7)
Neutrophils Relative %: 59.2 % (ref 43.0–77.0)
Platelets: 161 10*3/uL (ref 150.0–400.0)
RBC: 4.41 Mil/uL (ref 3.87–5.11)
RDW: 14 % (ref 11.5–15.5)
WBC: 6.5 10*3/uL (ref 4.0–10.5)

## 2016-03-31 LAB — VITAMIN D 25 HYDROXY (VIT D DEFICIENCY, FRACTURES): VITD: 36.74 ng/mL (ref 30.00–100.00)

## 2016-03-31 LAB — TSH: TSH: 1.41 u[IU]/mL (ref 0.35–4.50)

## 2016-03-31 MED ORDER — LISINOPRIL-HYDROCHLOROTHIAZIDE 10-12.5 MG PO TABS: 1.0000 | ORAL_TABLET | Freq: Every day | ORAL | 1 refills | Status: DC

## 2016-03-31 MED ORDER — RANITIDINE HCL 150 MG PO TABS: 150.0000 mg | ORAL_TABLET | Freq: Every day | ORAL | 1 refills | Status: DC

## 2016-03-31 MED ORDER — FLUOXETINE HCL 40 MG PO CAPS: 40.0000 mg | ORAL_CAPSULE | Freq: Every day | ORAL | 1 refills | Status: DC

## 2016-03-31 NOTE — Progress Notes (Signed)
   Subjective:    Patient ID: Rachel Scott, female    DOB: 01-10-1966, 51 y.o.   MRN: [id number]  HPI CPE- due for mammo this month (plans to schedule at [facility]).  Due for colonoscopy now that she's 50- pt prefers cologuard.  Due for Tdap- pt declines.  No need for pap due to hysterectomy.     Review of Systems Patient reports no vision/ hearing changes, adenopathy,fever, weight change,  persistant/recurrent hoarseness , swallowing issues, chest pain, palpitations, edema, persistant/recurrent cough, hemoptysis, dyspnea (rest/exertional/paroxysmal nocturnal), gastrointestinal bleeding (melena, rectal bleeding), abdominal pain, significant heartburn, bowel changes, GU symptoms (dysuria, hematuria, incontinence), Gyn symptoms (abnormal  bleeding, pain),  syncope, focal weakness, memory loss, numbness & tingling, skin/hair/nail changes, abnormal bruising or bleeding, anxiety, or depression.     Objective:   Physical Exam General Appearance:    Alert, cooperative, no distress, appears stated age  Head:    Normocephalic, without obvious abnormality, atraumatic  Eyes:    PERRL, conjunctiva/corneas clear, EOM's intact, fundi    benign, both eyes  Ears:    Normal TM's and external ear canals, both ears  Nose:   Nares normal, septum midline, mucosa normal, no drainage    or sinus tenderness  Throat:   Lips, mucosa, and tongue normal; teeth and gums normal  Neck:   Supple, symmetrical, trachea midline, no adenopathy;    Thyroid: no enlargement/tenderness/nodules  Back:     Symmetric, no curvature, ROM normal, no CVA tenderness  Lungs:     Clear to auscultation bilaterally, respirations unlabored  Chest Wall:    No tenderness or deformity   Heart:    Regular rate and rhythm, S1 and S2 normal, no murmur, rub   or gallop  Breast Exam:    Deferred to mammo  Abdomen:     Soft, non-tender, bowel sounds active all four quadrants,    no masses, no organomegaly  Genitalia:    Deferred  Rectal:      Extremities:   Extremities normal, atraumatic, no cyanosis or edema  Pulses:   2+ and symmetric all extremities  Skin:   Skin color, texture, turgor normal, no rashes or lesions  Lymph nodes:   Cervical, supraclavicular, and axillary nodes normal  Neurologic:   CNII-XII intact, normal strength, sensation and reflexes    throughout          Assessment & Plan:

## 2016-03-31 NOTE — Assessment & Plan Note (Signed)
Pt's PE WNL w/ exception of being overweight.  Due for mammo.  Pt plans to schedule.  She declines colonoscopy- opts for cologuard.  Refused Tdap and flu.  Check labs.  Anticipatory guidance provided.

## 2016-03-31 NOTE — Progress Notes (Signed)
Pre visit review using our clinic review tool, if applicable. No additional management support is needed unless otherwise documented below in the visit note. 

## 2016-03-31 NOTE — Patient Instructions (Signed)
Follow up in 6 months to recheck BP and cholesterol We'll notify you of your lab results and make any changes if needed Continue to work on healthy diet and regular exercise- you can do it! QUIT SMOKING!!!  You've got this!! Call and schedule your mammogram at [facility] Complete the cologuard as directed Call with any questions or concerns CONGRATS on the new place!!!

## 2016-04-11 ENCOUNTER — Ambulatory Visit (HOSPITAL_BASED_OUTPATIENT_CLINIC_OR_DEPARTMENT_OTHER): Payer: Managed Care, Other (non HMO)

## 2016-04-12 ENCOUNTER — Ambulatory Visit (HOSPITAL_BASED_OUTPATIENT_CLINIC_OR_DEPARTMENT_OTHER)
Admission: RE | Admit: 2016-04-12 | Discharge: 2016-04-12 | Disposition: A | Discharge status: Home / Self Care | Payer: Managed Care, Other (non HMO) | Source: Ambulatory Visit | Attending: Family Medicine | Admitting: Family Medicine

## 2016-04-12 ENCOUNTER — Encounter (HOSPITAL_BASED_OUTPATIENT_CLINIC_OR_DEPARTMENT_OTHER): Payer: Self-pay

## 2016-04-12 DIAGNOSIS — Z1231 Encounter for screening mammogram for malignant neoplasm of breast: Secondary | ICD-10-CM | POA: Insufficient documentation

## 2016-05-13 ENCOUNTER — Other Ambulatory Visit (HOSPITAL_BASED_OUTPATIENT_CLINIC_OR_DEPARTMENT_OTHER): Payer: Managed Care, Other (non HMO)

## 2016-05-13 ENCOUNTER — Encounter: Payer: Self-pay | Admitting: Hematology & Oncology

## 2016-05-13 ENCOUNTER — Ambulatory Visit (HOSPITAL_BASED_OUTPATIENT_CLINIC_OR_DEPARTMENT_OTHER): Payer: Managed Care, Other (non HMO) | Admitting: Hematology & Oncology

## 2016-05-13 VITALS — BP 143/71 | HR 67 | Temp 98.2°F | Resp 20 | Wt 170.0 lb

## 2016-05-13 DIAGNOSIS — D751 Secondary polycythemia: Secondary | ICD-10-CM

## 2016-05-13 DIAGNOSIS — R945 Abnormal results of liver function studies: Secondary | ICD-10-CM

## 2016-05-13 DIAGNOSIS — R7989 Other specified abnormal findings of blood chemistry: Secondary | ICD-10-CM

## 2016-05-13 LAB — CBC WITH DIFFERENTIAL (CANCER CENTER ONLY)
BASO#: 0.1 10*3/uL (ref 0.0–0.2)
BASO%: 0.6 % (ref 0.0–2.0)
EOS%: 2.5 % (ref 0.0–7.0)
Eosinophils Absolute: 0.2 10*3/uL (ref 0.0–0.5)
HEMATOCRIT: 44.9 % (ref 34.8–46.6)
HGB: 15.6 g/dL (ref 11.6–15.9)
LYMPH#: 3.1 10*3/uL (ref 0.9–3.3)
LYMPH%: 37.2 % (ref 14.0–48.0)
MCH: 35.7 pg — AB (ref 26.0–34.0)
MCHC: 34.7 g/dL (ref 32.0–36.0)
MCV: 103 fL — AB (ref 81–101)
MONO#: 0.6 10*3/uL (ref 0.1–0.9)
MONO%: 6.8 % (ref 0.0–13.0)
NEUT#: 4.4 10*3/uL (ref 1.5–6.5)
NEUT%: 52.9 % (ref 39.6–80.0)
Platelets: 166 10*3/uL (ref 145–400)
RBC: 4.37 10*6/uL (ref 3.70–5.32)
RDW: 13 % (ref 11.1–15.7)
WBC: 8.3 10*3/uL (ref 3.9–10.0)

## 2016-05-13 LAB — CMP (CANCER CENTER ONLY)
ALK PHOS: 78 U/L (ref 26–84)
ALT(SGPT): 86 U/L — ABNORMAL HIGH (ref 10–47)
AST: 95 U/L — ABNORMAL HIGH (ref 11–38)
Albumin: 4 g/dL (ref 3.3–5.5)
BILIRUBIN TOTAL: 0.7 mg/dL (ref 0.20–1.60)
BUN: 11 mg/dL (ref 7–22)
CALCIUM: 10.9 mg/dL — AB (ref 8.0–10.3)
CHLORIDE: 98 meq/L (ref 98–108)
CO2: 31 meq/L (ref 18–33)
CREATININE: 1 mg/dL (ref 0.6–1.2)
Glucose, Bld: 108 mg/dL (ref 73–118)
POTASSIUM: 4 meq/L (ref 3.3–4.7)
Sodium: 146 mEq/L — ABNORMAL HIGH (ref 128–145)
Total Protein: 7.7 g/dL (ref 6.4–8.1)

## 2016-05-13 NOTE — Progress Notes (Signed)
Hematology and Oncology Follow Up Visit  Rachel Scott [id number] [birth year] 51 y.o. 05/13/2016   Principle Diagnosis:   Polycythemia - secondary (JAK2 -)  Current Therapy:    Aspirin 81 mg by mouth daily  Phlebotomy to maintain hematocrit below 45%     Interim History:  Rachel Scott is back for follow-up. She looks real good. She feels good.   She will be going to [place] in early May. She is looking forward to this.  Her office moved. She is not too happy with the new office location. It is one of these open work spaces.  She is still smoking. She 5 smokes half pack per day.  She is drinking a little bit too much. She does admit to this. Her liver tests have been a little on the higher side.  She's not had any problems with fever. She's had no influenza. She's had no rashes. She's had no change in bowel or bladder habits. She's had no cough.   Overall, her performance status is ECOG 1  Medications:  Current Outpatient Prescriptions:  .  aspirin 81 MG tablet, Take 81 mg by mouth daily., Disp: , Rfl:  .  Cholecalciferol (VITAMIN D3) 2000 units capsule, Take 2,000 Units by mouth daily., Disp: , Rfl:  .  FLUoxetine (PROZAC) 40 MG capsule, Take 1 capsule (40 mg total) by mouth daily., Disp: 90 capsule, Rfl: 1 .  lisinopril-hydrochlorothiazide (PRINZIDE,ZESTORETIC) 10-12.5 MG tablet, Take 1 tablet by mouth daily., Disp: 90 tablet, Rfl: 1 .  ranitidine (ZANTAC) 150 MG tablet, Take 1 tablet (150 mg total) by mouth daily., Disp: 90 tablet, Rfl: 1 .  vitamin B-12 (CYANOCOBALAMIN) 1000 MCG tablet, Take 1,000 mcg by mouth daily., Disp: , Rfl:   Allergies: No Known Allergies  Past Medical History, Surgical history, Social history, and Family History were reviewed and updated.  Review of Systems: As above  Physical Exam:  weight is 170 lb (77.1 kg). Her oral temperature is 98.2 F (36.8 C). Her blood pressure is 143/71 (abnormal) and her pulse is 67. Her respiration is 20 and  oxygen saturation is 96%.   Wt Readings from Last 3 Encounters:  05/13/16 170 lb (77.1 kg)  03/31/16 168 lb 6 oz (76.4 kg)  02/12/16 168 lb (76.2 kg)     Head and neck exam shows no ocular or oral lesions. She has decreased facial plethora. She has no conjunctival inflammation. She has no adenopathy in neck. Lungs are clear bilaterally. Cardiac exam regular rate and rhythm with no murmurs rubs or bruits. Abdomen is soft. Shows good bowel sounds. There is no fluid wave. There is no palpable liver or spleen tip. Back exam shows no tenderness over the spine, ribs or hips. Extremities no clubbing cyanosis or edema. Neurological exam shows no focal deficits. Skin exam shows a ruddy complexion.   Lab Results  Component Value Date   WBC 8.3 05/13/2016   HGB 15.6 05/13/2016   HCT 44.9 05/13/2016   MCV 103 (H) 05/13/2016   PLT 166 05/13/2016     Chemistry      Component Value Date/Time   NA 146 (H) 05/13/2016 1457   K 4.0 05/13/2016 1457   CL 98 05/13/2016 1457   CO2 31 05/13/2016 1457   BUN 11 05/13/2016 1457   CREATININE 1.0 05/13/2016 1457      Component Value Date/Time   CALCIUM 10.9 (H) 05/13/2016 1457   ALKPHOS 78 05/13/2016 1457   AST 95 (H) 05/13/2016 1457  ALT 86 (H) 05/13/2016 1457   BILITOT 0.70 05/13/2016 1457         Impression and Plan: Rachel Scott is a 51 year old white female. She has erythrocytosis. 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 Her erythropoietin level is quite low. She does smoke so she may have secondary polycythemia.  Her JAK2 assays negative.  I will hold off on phlebotomizing her today. She is very close to 45% but she feels well. She thinks that she can continue to  hold out on phlebotomy.  We will see her back before she goes to [place]. She goes on May 12. We will get her back to week before she goes. I am sure that she will need to be phlebotomized then.  Her liver function tests are slightly elevated. She says that she probably has been  drinking a little bit too much. She will be going to the [employer].   Volanda Napoleon, MD 3/23/20184:14 PM

## 2016-05-16 LAB — FERRITIN: FERRITIN: 44 ng/mL (ref 9–269)

## 2016-05-16 LAB — IRON AND TIBC
%SAT: 15 % — ABNORMAL LOW (ref 21–57)
IRON: 64 ug/dL (ref 41–142)
TIBC: 441 ug/dL (ref 236–444)
UIBC: 377 ug/dL (ref 120–384)

## 2016-06-24 ENCOUNTER — Other Ambulatory Visit (HOSPITAL_BASED_OUTPATIENT_CLINIC_OR_DEPARTMENT_OTHER): Payer: Managed Care, Other (non HMO)

## 2016-06-24 ENCOUNTER — Ambulatory Visit (HOSPITAL_BASED_OUTPATIENT_CLINIC_OR_DEPARTMENT_OTHER): Payer: Managed Care, Other (non HMO) | Admitting: Hematology & Oncology

## 2016-06-24 VITALS — BP 142/59 | HR 59 | Temp 98.2°F | Resp 19 | Wt 169.8 lb

## 2016-06-24 DIAGNOSIS — D751 Secondary polycythemia: Secondary | ICD-10-CM

## 2016-06-24 DIAGNOSIS — D45 Polycythemia vera: Secondary | ICD-10-CM | POA: Diagnosis not present

## 2016-06-24 LAB — CMP (CANCER CENTER ONLY)
ALT(SGPT): 71 U/L — ABNORMAL HIGH (ref 10–47)
AST: 74 U/L — ABNORMAL HIGH (ref 11–38)
Albumin: 4.1 g/dL (ref 3.3–5.5)
Alkaline Phosphatase: 66 U/L (ref 26–84)
BUN: 4 mg/dL — AB (ref 7–22)
CHLORIDE: 97 meq/L — AB (ref 98–108)
CO2: 27 meq/L (ref 18–33)
Calcium: 9.5 mg/dL (ref 8.0–10.3)
Creat: 0.9 mg/dl (ref 0.6–1.2)
Glucose, Bld: 139 mg/dL — ABNORMAL HIGH (ref 73–118)
POTASSIUM: 3.8 meq/L (ref 3.3–4.7)
Sodium: 137 mEq/L (ref 128–145)
TOTAL PROTEIN: 6.9 g/dL (ref 6.4–8.1)
Total Bilirubin: 0.9 mg/dl (ref 0.20–1.60)

## 2016-06-24 LAB — CBC WITH DIFFERENTIAL (CANCER CENTER ONLY)
BASO#: 0.1 10*3/uL (ref 0.0–0.2)
BASO%: 1.4 % (ref 0.0–2.0)
EOS ABS: 0.2 10*3/uL (ref 0.0–0.5)
EOS%: 2.2 % (ref 0.0–7.0)
HCT: 42.7 % (ref 34.8–46.6)
HGB: 15.2 g/dL (ref 11.6–15.9)
LYMPH#: 2.7 10*3/uL (ref 0.9–3.3)
LYMPH%: 37.1 % (ref 14.0–48.0)
MCH: 35.8 pg — AB (ref 26.0–34.0)
MCHC: 35.6 g/dL (ref 32.0–36.0)
MCV: 101 fL (ref 81–101)
MONO#: 0.4 10*3/uL (ref 0.1–0.9)
MONO%: 5.9 % (ref 0.0–13.0)
NEUT#: 3.8 10*3/uL (ref 1.5–6.5)
NEUT%: 53.4 % (ref 39.6–80.0)
PLATELETS: 176 10*3/uL (ref 145–400)
RBC: 4.25 10*6/uL (ref 3.70–5.32)
RDW: 13.4 % (ref 11.1–15.7)
WBC: 7.2 10*3/uL (ref 3.9–10.0)
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
## 2016-06-27 LAB — IRON AND TIBC
%SAT: 27 % (ref 21–57)
Iron: 118 ug/dL (ref 41–142)
TIBC: 433 ug/dL (ref 236–444)
UIBC: 314 ug/dL (ref 120–384)

## 2016-06-27 LAB — FERRITIN: Ferritin: 45 ng/ml (ref 9–269)

## 2016-08-26 ENCOUNTER — Ambulatory Visit: Payer: Managed Care, Other (non HMO) | Admitting: Hematology & Oncology

## 2016-08-26 ENCOUNTER — Other Ambulatory Visit: Payer: Managed Care, Other (non HMO)

## 2016-09-19 ENCOUNTER — Other Ambulatory Visit: Payer: Self-pay | Admitting: Family Medicine

## 2016-09-19 ENCOUNTER — Encounter: Payer: Self-pay | Admitting: Family Medicine

## 2016-09-19 DIAGNOSIS — E785 Hyperlipidemia, unspecified: Secondary | ICD-10-CM

## 2016-09-28 ENCOUNTER — Ambulatory Visit (INDEPENDENT_AMBULATORY_CARE_PROVIDER_SITE_OTHER): Payer: Managed Care, Other (non HMO) | Admitting: Family Medicine

## 2016-09-28 ENCOUNTER — Ambulatory Visit (HOSPITAL_BASED_OUTPATIENT_CLINIC_OR_DEPARTMENT_OTHER): Payer: Managed Care, Other (non HMO) | Admitting: Hematology & Oncology

## 2016-09-28 ENCOUNTER — Other Ambulatory Visit (HOSPITAL_BASED_OUTPATIENT_CLINIC_OR_DEPARTMENT_OTHER): Payer: Managed Care, Other (non HMO)

## 2016-09-28 ENCOUNTER — Encounter: Payer: Self-pay | Admitting: Family Medicine

## 2016-09-28 VITALS — BP 138/61 | HR 67 | Temp 98.4°F | Resp 18 | Wt 170.8 lb

## 2016-09-28 VITALS — BP 130/68 | HR 67 | Temp 98.3°F | Resp 17 | Ht 60.0 in | Wt 170.2 lb

## 2016-09-28 DIAGNOSIS — Z720 Tobacco use: Secondary | ICD-10-CM

## 2016-09-28 DIAGNOSIS — N189 Chronic kidney disease, unspecified: Secondary | ICD-10-CM

## 2016-09-28 DIAGNOSIS — E785 Hyperlipidemia, unspecified: Secondary | ICD-10-CM | POA: Diagnosis not present

## 2016-09-28 DIAGNOSIS — I10 Essential (primary) hypertension: Secondary | ICD-10-CM

## 2016-09-28 DIAGNOSIS — D45 Polycythemia vera: Secondary | ICD-10-CM | POA: Diagnosis not present

## 2016-09-28 DIAGNOSIS — D751 Secondary polycythemia: Secondary | ICD-10-CM | POA: Diagnosis not present

## 2016-09-28 LAB — CBC WITH DIFFERENTIAL (CANCER CENTER ONLY)
BASO#: 0.1 10*3/uL (ref 0.0–0.2)
BASO%: 0.8 % (ref 0.0–2.0)
EOS ABS: 0.2 10*3/uL (ref 0.0–0.5)
EOS%: 1.8 % (ref 0.0–7.0)
HCT: 44.6 % (ref 34.8–46.6)
HEMOGLOBIN: 15.8 g/dL (ref 11.6–15.9)
LYMPH#: 2.8 10*3/uL (ref 0.9–3.3)
LYMPH%: 33.5 % (ref 14.0–48.0)
MCH: 36.4 pg — ABNORMAL HIGH (ref 26.0–34.0)
MCHC: 35.4 g/dL (ref 32.0–36.0)
MCV: 103 fL — AB (ref 81–101)
MONO#: 0.6 10*3/uL (ref 0.1–0.9)
MONO%: 7 % (ref 0.0–13.0)
NEUT%: 56.9 % (ref 39.6–80.0)
NEUTROS ABS: 4.7 10*3/uL (ref 1.5–6.5)
PLATELETS: 175 10*3/uL (ref 145–400)
RBC: 4.34 10*6/uL (ref 3.70–5.32)
RDW: 12.7 % (ref 11.1–15.7)
WBC: 8.2 10*3/uL (ref 3.9–10.0)

## 2016-09-28 LAB — CMP (CANCER CENTER ONLY)
ALK PHOS: 91 U/L — AB (ref 26–84)
ALT(SGPT): 77 U/L — ABNORMAL HIGH (ref 10–47)
AST: 85 U/L — ABNORMAL HIGH (ref 11–38)
Albumin: 3.8 g/dL (ref 3.3–5.5)
BUN: 8 mg/dL (ref 7–22)
CO2: 28 mEq/L (ref 18–33)
CREATININE: 1.1 mg/dL (ref 0.6–1.2)
Calcium: 9.9 mg/dL (ref 8.0–10.3)
Chloride: 106 mEq/L (ref 98–108)
Glucose, Bld: 119 mg/dL — ABNORMAL HIGH (ref 73–118)
POTASSIUM: 3.5 meq/L (ref 3.3–4.7)
Sodium: 134 mEq/L (ref 128–145)
TOTAL PROTEIN: 7 g/dL (ref 6.4–8.1)
Total Bilirubin: 0.9 mg/dl (ref 0.20–1.60)

## 2016-09-28 LAB — HEPATIC FUNCTION PANEL
ALBUMIN: 4.5 g/dL (ref 3.5–5.2)
ALK PHOS: 82 U/L (ref 39–117)
ALT: 69 U/L — ABNORMAL HIGH (ref 0–35)
AST: 67 U/L — AB (ref 0–37)
Bilirubin, Direct: 0.2 mg/dL (ref 0.0–0.3)
TOTAL PROTEIN: 7.3 g/dL (ref 6.0–8.3)
Total Bilirubin: 0.8 mg/dL (ref 0.2–1.2)

## 2016-09-28 LAB — BASIC METABOLIC PANEL
BUN: 7 mg/dL (ref 6–23)
CO2: 31 meq/L (ref 19–32)
Calcium: 10 mg/dL (ref 8.4–10.5)
Chloride: 100 mEq/L (ref 96–112)
Creatinine, Ser: 0.88 mg/dL (ref 0.40–1.20)
GFR: 72.08 mL/min (ref >60.00)
GLUCOSE: 119 mg/dL — AB (ref 70–99)
POTASSIUM: 4.8 meq/L (ref 3.5–5.1)
SODIUM: 137 meq/L (ref 135–145)

## 2016-09-28 LAB — LIPID PANEL
CHOLESTEROL: 185 mg/dL (ref 0–200)
HDL: 37 mg/dL — ABNORMAL LOW (ref >39.00)
LDL CALC: 127 mg/dL — AB (ref 0–99)
NonHDL: 147.87
Total CHOL/HDL Ratio: 5
Triglycerides: 104 mg/dL (ref 0.0–149.0)
VLDL: 20.8 mg/dL (ref 0.0–40.0)

## 2016-09-28 LAB — CBC WITH DIFFERENTIAL/PLATELET
BASOS ABS: 0.1 10*3/uL (ref 0.0–0.1)
Basophils Relative: 0.9 % (ref 0.0–3.0)
EOS ABS: 0.1 10*3/uL (ref 0.0–0.7)
Eosinophils Relative: 2 % (ref 0.0–5.0)
HCT: 48.6 % — ABNORMAL HIGH (ref 36.0–46.0)
HEMOGLOBIN: 16.4 g/dL — AB (ref 12.0–15.0)
LYMPHS ABS: 2.2 10*3/uL (ref 0.7–4.0)
Lymphocytes Relative: 32.8 % (ref 12.0–46.0)
MCHC: 33.7 g/dL (ref 30.0–36.0)
MCV: 108 fl — ABNORMAL HIGH (ref 78.0–100.0)
MONO ABS: 0.4 10*3/uL (ref 0.1–1.0)
Monocytes Relative: 5.8 % (ref 3.0–12.0)
NEUTROS PCT: 58.5 % (ref 43.0–77.0)
Neutro Abs: 3.8 10*3/uL (ref 1.4–7.7)
Platelets: 172 10*3/uL (ref 150.0–400.0)
RBC: 4.5 Mil/uL (ref 3.87–5.11)
RDW: 13.8 % (ref 11.5–15.5)
WBC: 6.6 10*3/uL (ref 4.0–10.5)

## 2016-09-28 MED ORDER — LISINOPRIL-HYDROCHLOROTHIAZIDE 10-12.5 MG PO TABS: 1.0000 | ORAL_TABLET | Freq: Every day | ORAL | 1 refills | Status: DC

## 2016-09-28 MED ORDER — FLUOXETINE HCL 40 MG PO CAPS: 40.0000 mg | ORAL_CAPSULE | Freq: Every day | ORAL | 1 refills | Status: DC

## 2016-09-28 MED ORDER — RANITIDINE HCL 150 MG PO TABS: 150.0000 mg | ORAL_TABLET | Freq: Every day | ORAL | 1 refills | Status: DC
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
## 2016-09-28 NOTE — Progress Notes (Signed)
   Subjective:    Patient ID: Rachel Scott, female    DOB: 1966-01-30, 51 y.o.   MRN: [id number]  HPI HTN- chronic problem.  On Lisinopril HCTZ w/ adequate control.  No CP, SOB, HAs, visual changes, edema.  Hyperlipidemia- chronic problem.  Attempting to control w/ healthy diet and regular exercise.  No abd pain, N/V.  Pt is walking regularly.   Review of Systems For ROS see HPI     Objective:   Physical Exam  Constitutional: She is oriented to person, place, and time. She appears well-developed and well-nourished. No distress.  HENT:  Head: Normocephalic and atraumatic.  Eyes: Pupils are equal, round, and reactive to light. Conjunctivae and EOM are normal.  Neck: Normal range of motion. Neck supple. No thyromegaly present.  Cardiovascular: Normal rate, regular rhythm, normal heart sounds and intact distal pulses.   No murmur heard. Pulmonary/Chest: Effort normal and breath sounds normal. No respiratory distress.  Abdominal: Soft. She exhibits no distension. There is no tenderness.  Musculoskeletal: She exhibits no edema.  Lymphadenopathy:    She has no cervical adenopathy.  Neurological: She is alert and oriented to person, place, and time.  Skin: Skin is warm and dry.  Psychiatric: She has a normal mood and affect. Her behavior is normal.  Vitals reviewed.         Assessment & Plan:

## 2016-09-28 NOTE — Assessment & Plan Note (Signed)
Chronic problem.  Adequate control today on current meds.  Asymptomatic.  Check labs.  No anticipated med changes.

## 2016-09-28 NOTE — Assessment & Plan Note (Signed)
Chronic problem.  Currently diet controlled.  Asymptomatic.  Check labs.  Start meds prn.

## 2016-09-28 NOTE — Progress Notes (Signed)
Pre visit review using our clinic review tool, if applicable. No additional management support is needed unless otherwise documented below in the visit note. 

## 2016-09-28 NOTE — Patient Instructions (Signed)
Schedule your complete physical in 6 months We'll notify you of your lab results and make any changes if needed Complete the Cologuard as directed and return at your convenience Continue to work on healthy diet and regular exercise- you're doing great! Call with any questions or concerns Enjoy the rest of your summer!!!

## 2016-09-29 ENCOUNTER — Encounter: Payer: Self-pay | Admitting: General Practice

## 2016-09-29 ENCOUNTER — Other Ambulatory Visit (INDEPENDENT_AMBULATORY_CARE_PROVIDER_SITE_OTHER): Payer: Managed Care, Other (non HMO)

## 2016-09-29 DIAGNOSIS — R7309 Other abnormal glucose: Secondary | ICD-10-CM | POA: Diagnosis not present

## 2016-09-29 LAB — IRON AND TIBC
%SAT: 41 % (ref 21–57)
Iron: 178 ug/dL — ABNORMAL HIGH (ref 41–142)
TIBC: 436 ug/dL (ref 236–444)
UIBC: 258 ug/dL (ref 120–384)

## 2016-09-29 LAB — FERRITIN: Ferritin: 41 ng/ml (ref 9–269)

## 2016-09-29 LAB — HEMOGLOBIN A1C: HEMOGLOBIN A1C: 5.6 % (ref 4.6–6.5)

## 2016-11-29 ENCOUNTER — Encounter: Payer: Self-pay | Admitting: Family Medicine

## 2016-11-29 MED ORDER — FLUOXETINE HCL 20 MG PO TABS: 20.0000 mg | ORAL_TABLET | Freq: Every day | ORAL | 3 refills | Status: DC

## 2017-02-16 ENCOUNTER — Telehealth: Payer: Managed Care, Other (non HMO) | Admitting: Family

## 2017-02-16 DIAGNOSIS — J029 Acute pharyngitis, unspecified: Secondary | ICD-10-CM

## 2017-02-16 MED ORDER — BENZONATATE 100 MG PO CAPS: 100.0000 mg | ORAL_CAPSULE | Freq: Three times a day (TID) | ORAL | 0 refills | Status: DC | PRN

## 2017-02-16 MED ORDER — PREDNISONE 5 MG PO TABS: 5.0000 mg | ORAL_TABLET | ORAL | 0 refills | Status: DC

## 2017-02-16 MED ORDER — FLUTICASONE PROPIONATE 50 MCG/ACT NA SUSP: 1.0000 | Freq: Two times a day (BID) | NASAL | 6 refills | Status: DC

## 2017-02-16 NOTE — Progress Notes (Signed)
Thank you for the details you included in the comment boxes. Those details are very helpful in determining the best course of treatment for you and help [place] to provide the best care. You will see a template for cough below which will also cover sinus. These symptoms are consistent with viral pharyngitis.  We are sorry that you are not feeling well.  Here is how we plan to help!  Based on your presentation I believe you most likely have A cough due to a virus.  This is called viral bronchitis and is best treated by rest, plenty of fluids and control of the cough.  You may use Ibuprofen or Tylenol as directed to help your symptoms.     In addition you may use A non-prescription cough medication called Mucinex DM: take 2 tablets every 12 hours. and A prescription cough medication called Tessalon Perles 100mg . You may take 1-2 capsules every 8 hours as needed for your cough.  Sterapred 5 mg dosepak And Flonase (2 sprays per nostril daily)  From your responses in the eVisit questionnaire you describe inflammation in the upper respiratory tract which is causing a significant cough.  This is commonly called Bronchitis and has four common causes:    Allergies  Viral Infections  Acid Reflux  Bacterial Infection Allergies, viruses and acid reflux are treated by controlling symptoms or eliminating the cause. An example might be a cough caused by taking certain blood pressure medications. You stop the cough by changing the medication. Another example might be a cough caused by acid reflux. Controlling the reflux helps control the cough.  USE OF BRONCHODILATOR ("RESCUE") INHALERS: There is a risk from using your bronchodilator too frequently.  The risk is that over-reliance on a medication which only relaxes the muscles surrounding the breathing tubes can reduce the effectiveness of medications prescribed to reduce swelling and congestion of the tubes themselves.  Although you feel brief relief from the  bronchodilator inhaler, your asthma may actually be worsening with the tubes becoming more swollen and filled with mucus.  This can delay other crucial treatments, such as oral steroid medications. If you need to use a bronchodilator inhaler daily, several times per day, you should discuss this with your provider.  There are probably better treatments that could be used to keep your asthma under control.     HOME CARE . Only take medications as instructed by your medical team. . Complete the entire course of an antibiotic. . Drink plenty of fluids and get plenty of rest. . Avoid close contacts especially the very young and the elderly . Cover your mouth if you cough or cough into your sleeve. . Always remember to wash your hands . A steam or ultrasonic humidifier can help congestion.   GET HELP RIGHT AWAY IF: . You develop worsening fever. . You become short of breath . You cough up blood. . Your symptoms persist after you have completed your treatment plan MAKE SURE YOU   Understand these instructions.  Will watch your condition.  Will get help right away if you are not doing well or get worse.  Your e-visit answers were reviewed by a board certified advanced clinical practitioner to complete your personal care plan.  Depending on the condition, your plan could have included both over the counter or prescription medications. If there is a problem please reply  once you have received a response from your provider. Your safety is important to [place].  If you have drug allergies  check your prescription carefully.    You can use MyChart to ask questions about today's visit, request a non-urgent call back, or ask for a work or school excuse for 24 hours related to this e-Visit. If it has been greater than 24 hours you will need to follow up with your provider, or enter a new e-Visit to address those concerns. You will get an e-mail in the next two days asking about your experience.  I hope that  your e-visit has been valuable and will speed your recovery. Thank you for using e-visits.

## 2017-02-20 ENCOUNTER — Ambulatory Visit: Payer: Self-pay | Admitting: *Deleted

## 2017-02-20 NOTE — Telephone Encounter (Signed)
Patient had E-visit and she feels that she has developed a sinus infection since that visit. Her symptoms did get better- but she feels her sinus got worse. The nasal discharge went from clear to green and now she has facial pressure and ear pressure.  Reason for Disposition . [1] SEVERE pain AND [2] not improved 2 hours after pain medicine  Answer Assessment - Initial Assessment Questions 1. LOCATION: "Where does it hurt?"      Pain above eyebrows, cheeks under eyes 2. ONSET: "When did the sinus pain start?"  (e.g., hours, days)      Started last week before E-visit- but the congestion was clear 3. SEVERITY: "How bad is the pain?"   (Scale 1-10; mild, moderate or severe)   - MILD (1-3): doesn't interfere with normal activities    - MODERATE (4-7): interferes with normal activities (e.g., work or school) or awakens from sleep   - SEVERE (8-10): excruciating pain and patient unable to do any normal activities        7-8 4. RECURRENT SYMPTOM: "Have you ever had sinus problems before?" If so, ask: "When was the last time?" and "What happened that time?"      no 5. NASAL CONGESTION: "Is the nose blocked?" If so, ask, "Can you open it or must you breathe through the mouth?"     yes 6. NASAL DISCHARGE: "Do you have discharge from your nose?" If so ask, "What color?"     Green now 7. FEVER: "Do you have a fever?" If so, ask: "What is it, how was it measured, and when did it start?"      no 8. OTHER SYMPTOMS: "Do you have any other symptoms?" (e.g., sore throat, cough, earache, difficulty breathing)     Cough,sore throat, ears feel a little clogged 9. PREGNANCY: "Is there any chance you are pregnant?" "When was your last menstrual period?"     n/a  Protocols used: SINUS PAIN OR CONGESTION-A-AH

## 2017-02-20 NOTE — Telephone Encounter (Signed)
Noted  

## 2017-03-20 ENCOUNTER — Encounter: Payer: Self-pay | Admitting: Hematology & Oncology

## 2017-04-05 ENCOUNTER — Ambulatory Visit (INDEPENDENT_AMBULATORY_CARE_PROVIDER_SITE_OTHER): Payer: Managed Care, Other (non HMO) | Admitting: Family Medicine

## 2017-04-05 ENCOUNTER — Encounter: Payer: Self-pay | Admitting: Family Medicine

## 2017-04-05 ENCOUNTER — Encounter: Payer: Self-pay | Admitting: Family

## 2017-04-05 ENCOUNTER — Other Ambulatory Visit: Payer: Self-pay

## 2017-04-05 ENCOUNTER — Inpatient Hospital Stay: Payer: Managed Care, Other (non HMO) | Attending: Hematology & Oncology | Admitting: Family

## 2017-04-05 ENCOUNTER — Inpatient Hospital Stay: Payer: Managed Care, Other (non HMO)

## 2017-04-05 VITALS — BP 102/60 | HR 66 | Temp 98.5°F | Resp 20 | Wt 169.8 lb

## 2017-04-05 VITALS — BP 124/72 | HR 76 | Temp 98.1°F | Resp 16 | Ht 60.0 in | Wt 168.2 lb

## 2017-04-05 DIAGNOSIS — D751 Secondary polycythemia: Secondary | ICD-10-CM | POA: Diagnosis not present

## 2017-04-05 DIAGNOSIS — Z1231 Encounter for screening mammogram for malignant neoplasm of breast: Secondary | ICD-10-CM | POA: Diagnosis not present

## 2017-04-05 DIAGNOSIS — Z79899 Other long term (current) drug therapy: Secondary | ICD-10-CM | POA: Diagnosis not present

## 2017-04-05 DIAGNOSIS — I10 Essential (primary) hypertension: Secondary | ICD-10-CM

## 2017-04-05 DIAGNOSIS — Z0000 Encounter for general adult medical examination without abnormal findings: Secondary | ICD-10-CM | POA: Diagnosis not present

## 2017-04-05 DIAGNOSIS — Z7982 Long term (current) use of aspirin: Secondary | ICD-10-CM | POA: Insufficient documentation

## 2017-04-05 LAB — LIPID PANEL
CHOL/HDL RATIO: 5
Cholesterol: 201 mg/dL — ABNORMAL HIGH (ref 0–200)
HDL: 39.1 mg/dL (ref >39.00)
LDL CALC: 131 mg/dL — AB (ref 0–99)
NONHDL: 161.55
TRIGLYCERIDES: 155 mg/dL — AB (ref 0.0–149.0)
VLDL: 31 mg/dL (ref 0.0–40.0)

## 2017-04-05 LAB — CBC WITH DIFFERENTIAL/PLATELET
BASOS ABS: 0.1 10*3/uL (ref 0.0–0.1)
Basophils Relative: 1.3 % (ref 0.0–3.0)
EOS ABS: 0.1 10*3/uL (ref 0.0–0.7)
Eosinophils Relative: 2.5 % (ref 0.0–5.0)
HCT: 46.4 % — ABNORMAL HIGH (ref 36.0–46.0)
Hemoglobin: 15.9 g/dL — ABNORMAL HIGH (ref 12.0–15.0)
LYMPHS ABS: 2.1 10*3/uL (ref 0.7–4.0)
Lymphocytes Relative: 37.9 % (ref 12.0–46.0)
MCHC: 34.3 g/dL (ref 30.0–36.0)
MCV: 103.6 fl — ABNORMAL HIGH (ref 78.0–100.0)
MONO ABS: 0.5 10*3/uL (ref 0.1–1.0)
Monocytes Relative: 8.4 % (ref 3.0–12.0)
NEUTROS PCT: 49.9 % (ref 43.0–77.0)
Neutro Abs: 2.8 10*3/uL (ref 1.4–7.7)
PLATELETS: 150 10*3/uL (ref 150.0–400.0)
RBC: 4.48 Mil/uL (ref 3.87–5.11)
RDW: 13.7 % (ref 11.5–15.5)
WBC: 5.6 10*3/uL (ref 4.0–10.5)

## 2017-04-05 LAB — CMP (CANCER CENTER ONLY)
ALBUMIN: 4.1 g/dL (ref 3.5–5.0)
ALK PHOS: 77 U/L (ref 40–150)
ALT: 69 U/L — ABNORMAL HIGH (ref 0–55)
AST: 67 U/L — AB (ref 5–34)
Anion gap: 16 — ABNORMAL HIGH (ref 3–11)
BILIRUBIN TOTAL: 0.8 mg/dL (ref 0.2–1.2)
BUN: 10 mg/dL (ref 7–26)
CO2: 24 mmol/L (ref 22–29)
Calcium: 9.8 mg/dL (ref 8.4–10.4)
Chloride: 101 mmol/L (ref 98–109)
Creatinine: 0.93 mg/dL (ref 0.60–1.10)
GFR, Est AFR Am: >60 mL/min (ref >60)
GFR, Estimated: >60 mL/min (ref >60)
GLUCOSE: 212 mg/dL — AB (ref 70–140)
POTASSIUM: 3.6 mmol/L (ref 3.5–5.1)
SODIUM: 141 mmol/L (ref 136–145)
TOTAL PROTEIN: 7.3 g/dL (ref 6.4–8.3)

## 2017-04-05 LAB — CBC WITH DIFFERENTIAL (CANCER CENTER ONLY)
BASOS ABS: 0.1 10*3/uL (ref 0.0–0.1)
Basophils Relative: 1 %
EOS ABS: 0.1 10*3/uL (ref 0.0–0.5)
Eosinophils Relative: 2 %
HEMATOCRIT: 44.7 % (ref 34.8–46.6)
HEMOGLOBIN: 15.2 g/dL (ref 11.6–15.9)
Lymphocytes Relative: 40 %
Lymphs Abs: 2.9 10*3/uL (ref 0.9–3.3)
MCH: 35.5 pg — ABNORMAL HIGH (ref 26.0–34.0)
MCHC: 34 g/dL (ref 32.0–36.0)
MCV: 104.4 fL — ABNORMAL HIGH (ref 81.0–101.0)
Monocytes Absolute: 0.4 10*3/uL (ref 0.1–0.9)
Monocytes Relative: 6 %
NEUTROS ABS: 3.6 10*3/uL (ref 1.5–6.5)
NEUTROS PCT: 51 %
Platelet Count: 153 10*3/uL (ref 145–400)
RBC: 4.28 MIL/uL (ref 3.70–5.32)
RDW: 13 % (ref 11.1–15.7)
WBC Count: 7.2 10*3/uL (ref 3.9–10.0)

## 2017-04-05 LAB — BASIC METABOLIC PANEL
BUN: 10 mg/dL (ref 6–23)
CALCIUM: 10 mg/dL (ref 8.4–10.5)
CO2: 29 meq/L (ref 19–32)
Chloride: 103 mEq/L (ref 96–112)
Creatinine, Ser: 0.77 mg/dL (ref 0.40–1.20)
GFR: 83.91 mL/min (ref >60.00)
GLUCOSE: 114 mg/dL — AB (ref 70–99)
Potassium: 5.3 mEq/L — ABNORMAL HIGH (ref 3.5–5.1)
Sodium: 139 mEq/L (ref 135–145)

## 2017-04-05 LAB — TSH: TSH: 1.3 u[IU]/mL (ref 0.35–4.50)

## 2017-04-05 LAB — HEPATIC FUNCTION PANEL
ALK PHOS: 70 U/L (ref 39–117)
ALT: 56 U/L — AB (ref 0–35)
AST: 50 U/L — AB (ref 0–37)
Albumin: 4.4 g/dL (ref 3.5–5.2)
Bilirubin, Direct: 0.2 mg/dL (ref 0.0–0.3)
TOTAL PROTEIN: 7.2 g/dL (ref 6.0–8.3)
Total Bilirubin: 0.8 mg/dL (ref 0.2–1.2)

## 2017-04-05 NOTE — Progress Notes (Signed)
Hematology and Oncology Follow Up Visit  Rachel Scott [id number] 1965/11/09 52 y.o. 04/05/2017   Principle Diagnosis:  Polycythemia - secondary (JAK2 -)  Current Therapy:   Aspirin 81 mg by mouth daily Phlebotomy to maintain hematocrit below 45%   Interim History:  Rachel Scott is here today for follow-up. She is doing well and has no complaints at this time. Hct is stable at 44.7%. She is still smoking 1/2-3/4 ppd.  She recently had a double ear infection and upper respiratory infection treated with tussonex, prednisone, z-pack and flonase. She is feeling much better and denies fatigue or weakness.  No fever, chills, n/v, cough, rash, dizziness, SOB, chest pain, palpitations, abdominal pain or changes in bowel or bladder habits.  No episodes of bleeding, no bruising or petechiae.  No lymphadenopathy found on exam. No swelling, tenderness, numbness or tingling in her extremities. No c/o pain.  She has maintained a good appetite and is staying well hydrated. Her weight is stable.  She is moving to [place] in the next month so this will be her last visit with our office. She plans to establish with primary care once she moves and let them take over.   ECOG Performance Status: 1 - Symptomatic but completely ambulatory  Medications:  Allergies as of 04/05/2017   No Known Allergies     Medication List        Accurate as of 04/05/17  2:44 PM. Always use your most recent med list.          aspirin 81 MG tablet Take 81 mg by mouth daily.   FLUoxetine 40 MG capsule Commonly known as:  PROZAC Take 40 mg by mouth daily.   FLUoxetine 20 MG tablet Commonly known as:  PROZAC Take 1 tablet (20 mg total) by mouth daily.   lisinopril-hydrochlorothiazide 10-12.5 MG tablet Commonly known as:  PRINZIDE,ZESTORETIC Take 1 tablet by mouth daily.   ranitidine 150 MG tablet Commonly known as:  ZANTAC Take 1 tablet (150 mg total) by mouth daily.   vitamin B-12 1000 MCG tablet Commonly  known as:  CYANOCOBALAMIN Take 1,000 mcg by mouth daily.   Vitamin D3 2000 units capsule Take 2,000 Units by mouth daily.   VITAMIN E PO Take by mouth.       Allergies: No Known Allergies  Past Medical History, Surgical history, Social history, and Family History were reviewed and updated.  Review of Systems: All other 10 point review of systems is negative.   Physical Exam:  weight is 169 lb 12.8 oz (77 kg). Her oral temperature is 98.5 F (36.9 C). Her blood pressure is 102/60 and her pulse is 66. Her respiration is 20 and oxygen saturation is 99%.   Wt Readings from Last 3 Encounters:  04/05/17 169 lb 12.8 oz (77 kg)  04/05/17 168 lb 4 oz (76.3 kg)  09/28/16 170 lb 12 oz (77.5 kg)    Ocular: Sclerae unicteric, pupils equal, round and reactive to light Ear-nose-throat: Oropharynx clear, dentition fair Lymphatic: No cervical, supraclavicular or axillary adenopathy Lungs no rales or rhonchi, good excursion bilaterally Heart regular rate and rhythm, no murmur appreciated Abd soft, nontender, positive bowel sounds, no liver or spleen tip palpated on exam, no fluid wave  MSK no focal spinal tenderness, no joint edema Neuro: non-focal, well-oriented, appropriate affect Breasts: Deferred   Lab Results  Component Value Date   WBC 7.2 04/05/2017   HGB 15.9 (H) 04/05/2017   HCT 44.7 04/05/2017   MCV 104.4 (H)  04/05/2017   PLT 153 04/05/2017   Lab Results  Component Value Date   FERRITIN 41 09/28/2016   IRON 178 (H) 09/28/2016   TIBC 436 09/28/2016   UIBC 258 09/28/2016   IRONPCTSAT 41 09/28/2016   Lab Results  Component Value Date   RBC 4.28 04/05/2017   No results found for: KPAFRELGTCHN, LAMBDASER, KAPLAMBRATIO No results found for: IGGSERUM, IGA, IGMSERUM No results found for: Odetta Pink, SPEI   Chemistry      Component Value Date/Time   NA 139 04/05/2017 1029   NA 134 09/28/2016 1348   K 5.3 (H)  04/05/2017 1029   K 3.5 09/28/2016 1348   CL 103 04/05/2017 1029   CL 106 09/28/2016 1348   CO2 29 04/05/2017 1029   CO2 28 09/28/2016 1348   BUN 10 04/05/2017 1029   BUN 8 09/28/2016 1348   CREATININE 0.77 04/05/2017 1029   CREATININE 1.1 09/28/2016 1348      Component Value Date/Time   CALCIUM 10.0 04/05/2017 1029   CALCIUM 9.9 09/28/2016 1348   ALKPHOS 70 04/05/2017 1029   ALKPHOS 91 (H) 09/28/2016 1348   AST 50 (H) 04/05/2017 1029   AST 85 (H) 09/28/2016 1348   ALT 56 (H) 04/05/2017 1029   ALT 77 (H) 09/28/2016 1348   BILITOT 0.8 04/05/2017 1029   BILITOT 0.90 09/28/2016 1348      Impression and Plan: Rachel Scott is a very pleasant 52 yo caucasian female with polycythemia secondary to smoking. She was JAK2 negative. She has nicotine patches with her and plans to quit.  Hct today is 44.7% so no phlebotomy this visit. She is taking her baby aspirin daily as prescribed.  She is moving to [place] next month and will re-establish care once she gets there.  We ware sad to see her go but happy she has this new opportunity. She will contact our office with any questions or concerns.  Laverna Peace, NP 2/13/20192:44 PM

## 2017-04-05 NOTE — Assessment & Plan Note (Signed)
Pt's PE WNL.  UTD on mammo.  Due for colonoscopy- pt is moving within the next month so not able to refer.  Due for flu and Tdap- pt declined.  Check labs.  Anticipatory guidance provided.

## 2017-04-05 NOTE — Patient Instructions (Signed)
Follow up in 6 months to recheck BP (if you haven't found a new doctor in [place]) We'll notify you of your lab results and make any changes if needed The order is in for your mammo- you can schedule at your convenience Continue to work on healthy diet and regular exercise- you can do it! When you get to [place], you need a colonoscopy Call with any questions or concerns Congrats on the Move!!!

## 2017-04-05 NOTE — Assessment & Plan Note (Signed)
Chronic problem.  Adequate control today.  Asymptomatic.  Check labs.  No anticipated med changes.  Will follow. 

## 2017-04-05 NOTE — Progress Notes (Signed)
   Subjective:    Patient ID: Rachel Scott, female    DOB: December 09, 1965, 52 y.o.   MRN: [id number]  HPI CPE- due for repeat mammogram this month.  Due for colonoscopy- [place] in the next [place].  Due for Tdap- declines.  Declines flu.   Review of Systems Patient reports no vision/ hearing changes, adenopathy,fever, weight change,  persistant/recurrent hoarseness , swallowing issues, chest pain, palpitations, edema, persistant/recurrent cough, hemoptysis, dyspnea (rest/exertional/paroxysmal nocturnal), gastrointestinal bleeding (melena, rectal bleeding), abdominal pain, significant heartburn, bowel changes, GU symptoms (dysuria, hematuria, incontinence), Gyn symptoms (abnormal  bleeding, pain),  syncope, focal weakness, memory loss, numbness & tingling, skin/hair/nail changes, abnormal bruising or bleeding, anxiety, or depression.     Objective:   Physical Exam General Appearance:    Alert, cooperative, no distress, appears stated age  Head:    Normocephalic, without obvious abnormality, atraumatic  Eyes:    PERRL, conjunctiva/corneas clear, EOM's intact, fundi    benign, both eyes  Ears:    Normal TM's and external ear canals, both ears  Nose:   Nares normal, septum midline, mucosa normal, no drainage    or sinus tenderness  Throat:   Lips, mucosa, and tongue normal; teeth and gums normal  Neck:   Supple, symmetrical, trachea midline, no adenopathy;    Thyroid: no enlargement/tenderness/nodules  Back:     Symmetric, no curvature, ROM normal, no CVA tenderness  Lungs:     Clear to auscultation bilaterally, respirations unlabored  Chest Wall:    No tenderness or deformity   Heart:    Regular rate and rhythm, S1 and S2 normal, no murmur, rub   or gallop  Breast Exam:    Deferred to mammo  Abdomen:     Soft, non-tender, bowel sounds active all four quadrants,    no masses, no organomegaly  Genitalia:    Deferred  Rectal:    Extremities:   Extremities normal, atraumatic, no  cyanosis or edema  Pulses:   2+ and symmetric all extremities  Skin:   Skin color, texture, turgor normal, no rashes or lesions  Lymph nodes:   Cervical, supraclavicular, and axillary nodes normal  Neurologic:   CNII-XII intact, normal strength, sensation and reflexes    throughout          Assessment & Plan:

## 2017-04-06 LAB — IRON AND TIBC
IRON: 163 ug/dL — AB (ref 41–142)
Saturation Ratios: 38 % (ref 21–57)
TIBC: 429 ug/dL (ref 236–444)
UIBC: 266 ug/dL

## 2017-04-06 LAB — FERRITIN: FERRITIN: 61 ng/mL (ref 9–269)

## 2017-04-07 ENCOUNTER — Encounter: Payer: Self-pay | Admitting: General Practice

## 2017-04-10 ENCOUNTER — Other Ambulatory Visit: Payer: Self-pay | Admitting: Family Medicine

## 2017-05-31 ENCOUNTER — Ambulatory Visit (HOSPITAL_BASED_OUTPATIENT_CLINIC_OR_DEPARTMENT_OTHER): Payer: Managed Care, Other (non HMO)

## 2017-05-31 ENCOUNTER — Ambulatory Visit (HOSPITAL_BASED_OUTPATIENT_CLINIC_OR_DEPARTMENT_OTHER)
Admission: RE | Admit: 2017-05-31 | Discharge: 2017-05-31 | Disposition: A | Discharge status: Home / Self Care | Payer: Managed Care, Other (non HMO) | Source: Ambulatory Visit | Attending: Family Medicine | Admitting: Family Medicine

## 2017-05-31 DIAGNOSIS — Z1231 Encounter for screening mammogram for malignant neoplasm of breast: Secondary | ICD-10-CM | POA: Diagnosis not present

## 2017-06-08 ENCOUNTER — Other Ambulatory Visit: Payer: Managed Care, Other (non HMO)

## 2017-06-08 ENCOUNTER — Ambulatory Visit: Payer: Managed Care, Other (non HMO) | Admitting: Hematology & Oncology

## 2017-06-21 ENCOUNTER — Other Ambulatory Visit: Payer: Self-pay | Admitting: Family Medicine

## 2017-06-21 NOTE — Telephone Encounter (Signed)
Last OV 04/05/17, No Future OV  Prozac last filled 11/29/16, # 30 with 3 refills  Zantac last filled 09/28/16, #90 with 1 refill

## 2017-06-22 ENCOUNTER — Other Ambulatory Visit: Payer: Self-pay | Admitting: General Practice

## 2017-06-22 MED ORDER — FLUOXETINE HCL 20 MG PO TABS: 20.0000 mg | ORAL_TABLET | Freq: Every day | ORAL | 3 refills | Status: AC
